# Patient Record
Sex: Female | Born: 1955 | Race: Black or African American | Hispanic: No | Marital: Single | Smoking: Former smoker
Health system: Southern US, Community
[De-identification: ages and names within clinical notes are randomized; demographics above are authoritative.]

## PROBLEM LIST (undated history)

## (undated) DIAGNOSIS — R569 Unspecified convulsions: Secondary | ICD-10-CM

## (undated) DIAGNOSIS — G9341 Metabolic encephalopathy: Secondary | ICD-10-CM

## (undated) DIAGNOSIS — E119 Type 2 diabetes mellitus without complications: Secondary | ICD-10-CM

## (undated) DIAGNOSIS — N186 End stage renal disease: Secondary | ICD-10-CM

## (undated) DIAGNOSIS — I10 Essential (primary) hypertension: Secondary | ICD-10-CM

## (undated) DIAGNOSIS — N289 Disorder of kidney and ureter, unspecified: Secondary | ICD-10-CM

## (undated) DIAGNOSIS — R011 Cardiac murmur, unspecified: Secondary | ICD-10-CM

## (undated) HISTORY — PX: EYE SURGERY: SHX253

## (undated) HISTORY — PX: CATARACT EXTRACTION: SUR2

## (undated) HISTORY — DX: Type 2 diabetes mellitus without complications: E11.9

## (undated) HISTORY — DX: Essential (primary) hypertension: I10

## (undated) HISTORY — DX: Unspecified convulsions: R56.9

## (undated) HISTORY — DX: Cardiac murmur, unspecified: R01.1

## (undated) HISTORY — DX: Disorder of kidney and ureter, unspecified: N28.9

---

## 2017-10-06 NOTE — Progress Notes (Signed)
[facility] Note  10/07/2017     CHIEF COMPLAINT Patient presents for Retina Evaluation and Diabetic Eye Exam   HISTORY OF PRESENT ILLNESS: Joann Bailey is a 62 y.o. female who presents to the clinic today for:   HPI    Retina Evaluation    In both eyes.  This started 1 month ago.  Associated Symptoms Floaters.  Negative for Blind Spot, Glare, Shoulder/Hip pain, Fatigue, Jaw Claudication, Photophobia, Distortion, Redness, Scalp Tenderness, Weight Loss, Fever, Trauma, Pain and Flashes.  Context:  distance vision, mid-range vision and near vision.  Treatments tried include no treatments, laser and surgery.  Response to treatment was mild improvement.  I, the attending physician,  performed the HPI with the patient and updated documentation appropriately.          Diabetic Eye Exam    Vision is blurred for distance, fluctuates with blood sugars and is blurred for near.  Associated Symptoms Negative for Blind Spot, Glare, Shoulder/Hip pain, Fatigue, Jaw Claudication, Photophobia, Distortion, Weight Loss, Scalp Tenderness, Redness, Floaters, Flashes, Pain, Trauma and Fever.  Diabetes characteristics include Type 2, on insulin and taking oral medications.  This started 18.  Blood sugar level fluctuates.  Last A1C 7.  Associated Diagnosis Dialysis and Kidney Disease.  I, the attending physician,  performed the HPI with the patient and updated documentation appropriately.          Comments    Patient referred by Dr. Owens Shark for retinal eval to evaluate possible TRD OS. Patient states she started having floaters(hair like) in her right eye appx one month ago, two weeks ago she noticed floaters in her left eye. Pt is a diabetic, she is on insulin and glipizide,and Januvia, she does not check Bs regularly, last A1C 7.0 three months ago. Pt reports her Bs are unstable. Pt reports she had laser tx several years ago ou, cataract sx Ou appx 5 yrs ago.  She is a dialysis  patient.  Pt take fish oil amd multivitamins. Denies gtt's       Last edited by Bernarda Caffey, MD on 10/07/2017 10:37 AM. (History)    Pt states she has seen Dr. Owens Shark twice; Pt states she has been treated with retinal lasers at "[facility]"; Pt states she has also had cataract sx; Pt states she has been having trouble with OS x "a couple of weeks ago"; Pt states she had been seeing floaters after "falling out of the bed"; Pt states she is on dialysis 2x a week; Pt is unsure when she began having problems with OU;  Referring physician: Montel Culver, MD [address] [place] [address]  HISTORICAL INFORMATION:   Selected notes from the MEDICAL RECORD NUMBER Referred by Dr. Kristian Covey for concern of TRD OS; LEE- [BCVA OD: 20/200 OS: 20/70] Ocular Hx- laser sx for diabetic retinopathy OU, pseudophakia OU PMH- HTN, DM, kidney failure    CURRENT MEDICATIONS: No current outpatient medications on file. (Ophthalmic Drugs)   No current facility-administered medications for this visit.  (Ophthalmic Drugs)   Current Outpatient Medications (Other)  Medication Sig  . ALPRAZolam (XANAX) 1 MG tablet Take by mouth.  Marland Kitchen amLODipine (NORVASC) 10 MG tablet Take by mouth.  Marland Kitchen amLODipine (NORVASC) 10 MG tablet   . aspirin 81 MG tablet 81 mg  . atenolol (TENORMIN) 50 MG tablet Take by mouth.  . clopidogrel (PLAVIX) 75 MG tablet Take by mouth.  . furosemide (LASIX) 20 MG tablet   .  gabapentin (NEURONTIN) 800 MG tablet Take by mouth.  Marland Kitchen glipiZIDE (GLUCOTROL) 10 MG tablet Take by mouth.  . hydrALAZINE (APRESOLINE) 100 MG tablet Take by mouth.  . insulin aspart (NOVOLOG) cartridge Inject into the skin.  Marland Kitchen insulin detemir (LEVEMIR) 100 UNIT/ML injection Inject into the skin.  Marland Kitchen levETIRAcetam (KEPPRA) 500 MG tablet Take 500 mg by mouth 2 (two) times daily.  . Omega-3 Fat Ac-Cholecalciferol (MINICAPS VITAMIN-D/OMEGA-3 PO) Take by mouth.  Marland Kitchen omeprazole (PRILOSEC) 20 MG capsule   . sitaGLIPtin (JANUVIA) 100 MG tablet  Take by mouth.   No current facility-administered medications for this visit.  (Other)      REVIEW OF SYSTEMS: ROS    Positive for: Neurological, Genitourinary, Musculoskeletal, Endocrine, Cardiovascular, Eyes, Respiratory   Negative for: Constitutional, Gastrointestinal, Skin, HENT, Allergic/Imm, Heme/Lymph   Last edited by Zenovia Jordan, LPN on 0/03/3817  2:99 AM. (History)       ALLERGIES No Known Allergies  PAST MEDICAL HISTORY Past Medical History:  Diagnosis Date  . Diabetes ([place])   . Heart murmur   . Hypertension   . Kidney disease    Past Surgical History:  Procedure Laterality Date  . CATARACT EXTRACTION    . EYE SURGERY      FAMILY HISTORY Family History  Problem Relation Age of Onset  . Hypertension Mother   . Cancer Mother   . Hypertension Father   . Diabetes Father   . Hypertension Sister   . Diabetes Sister   . Hypertension Brother   . Diabetes Brother     SOCIAL HISTORY Social History   Tobacco Use  . Smoking status: Former Research scientist (life sciences)  . Smokeless tobacco: Never Used  Substance Use Topics  . Alcohol use: Never    Frequency: Never  . Drug use: Never         OPHTHALMIC EXAM:  Base Eye Exam    Visual Acuity (Snellen - Linear)      Right Left   Dist [place] 20/150 CF at 2'   Dist ph [place] 20/100 -2 20/400       Tonometry (Tonopen, 9:17 AM)      Right Left   Pressure 17 14       Pupils      Dark Light Shape React APD   Right 3 3 Round NR None   Left 3 3 Round NR None       Visual Fields (Counting fingers)      Left Right   Restrictions Partial outer superior temporal, inferior temporal, superior nasal, inferior nasal deficiencies Partial outer superior temporal, inferior temporal deficiencies       Extraocular Movement      Right Left    Full, Ortho Full, Ortho       Neuro/Psych    Oriented x3:  Yes   Mood/Affect:  Normal       Dilation    Both eyes:  1.0% Mydriacyl, 2.5% Phenylephrine @ 9:17 AM        Slit Lamp and  Fundus Exam    Slit Lamp Exam      Right Left   Lids/Lashes Dermatochalasis - upper lid Dermatochalasis - upper lid   Conjunctiva/Sclera Melanosis Melanosis   Cornea Mild Arcus Mild Arcus   Anterior Chamber Deep and quiet Deep and quiet   Iris Round and dilated, No NVI Round and dilated, No NVI   Lens PC IOL in good position PC IOL in good position   Vitreous clear +heme, fibrosis  Fundus Exam      Right Left   Disc mild Pallor hazy view, perfused   C/D Ratio 0.5    Macula Blunted foveal reflex, central atrophy, Pigment clumping, temporal fibrosis, IRH inferiorly , Microaneurysms No details visible, obsured by heme   Vessels Severe Vascular attenuation, sclerotic arterioles +NVE and preretinal fibrosis   Periphery Dense PRP, pre-retinal fibrosis nasal to disc,  Superior pre-retinal fibrosis, IRH, PRP scars, large area of pre-retinal fibrosis with focal tractional retinal detachment, red vitreous hemorrhage inferiorly obscuring view        Refraction    Manifest Refraction (Auto)      Sphere Cylinder Axis Dist [place]   Right -1.00 +0.75 179 20/100-2   Left unable     Would not read OS. Attempted refraction/retinoscope OS-dark reflex OS.          IMAGING AND PROCEDURES  Imaging and Procedures for 10/12/17  OCT, Retina - OU - Both Eyes       Right Eye Quality was good. Central Foveal Thickness: 141. Progression has no prior data. Findings include abnormal foveal contour, outer retinal atrophy, inner retinal atrophy, intraretinal fluid, vitreous traction.   Left Eye Quality was poor. Progression has no prior data.   Notes *Images captured and stored on drive  Diagnosis / Impression:  OD: severe retinal atrophy; no CSME OS: no view  Clinical management:  See below  Abbreviations: NFP - Normal foveal profile. CME - cystoid macular edema. PED - pigment epithelial detachment. IRF - intraretinal fluid. SRF - subretinal fluid. EZ - ellipsoid zone. ERM - epiretinal  membrane. ORA - outer retinal atrophy. ORT - outer retinal tubulation. SRHM - subretinal hyper-reflective material         B-Scan Ultrasound - OS - Left Eye       Quality was good. Findings included vitreous hemorrhage, vitreous opacities, subhyaloid opacities, traction retinal detachment.   Notes Impression:  OS: vitreous opacities consistent with vitreous hemorrhage; hyperechoic signal along superior arcades consistent with subhyaloid opacities and concerning for tractional retinal detachment. No mass or rhegmatogenous RD noted.                ASSESSMENT/PLAN:    ICD-10-CM   1. 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 Vitreous hemorrhage of left eye (HCC) H43.12 B-Scan Ultrasound - OS - Left Eye  3. Retinal edema H35.81 OCT, Retina - OU - Both Eyes  4. Pseudophakia of both eyes Z96.1     1. Proliferative diabetic retinopathy, OU - The incidence, risk factors for progression, natural history and treatment options for diabetic retinopathy  were discussed with patient.   - The need for close monitoring of blood glucose, blood pressure, and serum lipids, avoiding cigarette or any type of tobacco, and the need for long term follow up was also discussed with patient. - exam shows extensive central retinal atrophy and low vision OD;  OS with dense central vitreous hemorrhage, preretinal fibrosis along superior arcades with focal tractional RD - likely needs PPV w/ membrane peel and additional PRP laser OS - discussed possibility of surgery, but pt gets dialysis on Tuesdays and Thursdays - pt reports history of ophthalmic care at [facility] -- poor historian, but has had cataract surgery and/or retinal surgery/laser at [facility] - pt wishes to coordinate care at [facility] - will refer pt to [facility] for further evaluation and management  2. Vitreous  Hemorrhage OS secondary to  DMII VH precautions reviewed -- minimize activities, keep head elevated, avoid ASA/NSAIDs/blood thinners as able - refer to [facility]  3. Retinal edema   4. Pseudophakia OU  - s/p CE/IOL  - beautiful surgery, doing well  - monitor   Ophthalmic Meds Ordered this visit:  No orders of the defined types were placed in this encounter.      Return if symptoms worsen or fail to improve.  There are no Patient Instructions on file for this visit.   Explained the diagnoses, plan, and follow up with the patient and they expressed understanding.  Patient expressed understanding of the importance of proper follow up care.   This document serves as a record of services personally performed by Gardiner Sleeper, MD, PhD. It was created on their behalf by Catha Brow, [place], a certified ophthalmic assistant. The creation of this record is the provider's dictation and/or activities during the visit.  Electronically signed by: Catha Brow, [place]  10/12/17 2:02 AM   Gardiner Sleeper, M.D., Ph.D. Diseases & Surgery of the Retina and [place] 10/12/17  I have reviewed the above documentation for accuracy and completeness, and I agree with the above. Gardiner Sleeper, M.D., Ph.D. 10/12/17 2:02 AM     Abbreviations: M myopia (nearsighted); A astigmatism; H hyperopia (farsighted); P presbyopia; Mrx spectacle prescription;  CTL contact lenses; OD right eye; OS left eye; OU both eyes  XT exotropia; ET esotropia; PEK punctate epithelial keratitis; PEE punctate epithelial erosions; DES dry eye syndrome; MGD meibomian gland dysfunction; ATs artificial tears; PFAT's preservative free artificial tears; [place] nuclear sclerotic cataract; PSC posterior subcapsular cataract; ERM epi-retinal membrane; PVD posterior vitreous detachment; RD retinal detachment; DM diabetes mellitus; DR diabetic retinopathy; NPDR non-proliferative diabetic retinopathy; PDR proliferative diabetic  retinopathy; CSME clinically significant macular edema; DME diabetic macular edema; dbh dot blot hemorrhages; CWS cotton wool spot; POAG primary open angle glaucoma; C/D cup-to-disc ratio; HVF humphrey visual field; GVF goldmann visual field; OCT optical coherence tomography; IOP intraocular pressure; BRVO Branch retinal vein occlusion; CRVO central retinal vein occlusion; CRAO central retinal artery occlusion; BRAO branch retinal artery occlusion; RT retinal tear; SB scleral buckle; PPV pars plana vitrectomy; VH Vitreous hemorrhage; PRP panretinal laser photocoagulation; IVK intravitreal kenalog; VMT vitreomacular traction; MH Macular hole;  NVD neovascularization of the disc; NVE neovascularization elsewhere; AREDS age related eye disease study; ARMD age related macular degeneration; POAG primary open angle glaucoma; EBMD epithelial/anterior basement membrane dystrophy; ACIOL anterior chamber intraocular lens; IOL intraocular lens; PCIOL posterior chamber intraocular lens; Phaco/IOL phacoemulsification with intraocular lens placement; [place] photorefractive keratectomy; LASIK laser assisted in situ keratomileusis; HTN hypertension; DM diabetes mellitus; COPD chronic obstructive pulmonary disease

## 2017-10-07 ENCOUNTER — Encounter (INDEPENDENT_AMBULATORY_CARE_PROVIDER_SITE_OTHER): Payer: Self-pay | Admitting: Ophthalmology

## 2017-10-07 ENCOUNTER — Ambulatory Visit (INDEPENDENT_AMBULATORY_CARE_PROVIDER_SITE_OTHER): Payer: Medicare Other | Admitting: Ophthalmology

## 2017-10-07 DIAGNOSIS — H3581 Retinal edema: Secondary | ICD-10-CM

## 2017-10-07 DIAGNOSIS — H4312 Vitreous hemorrhage, left eye: Secondary | ICD-10-CM

## 2017-10-07 DIAGNOSIS — Z961 Presence of intraocular lens: Secondary | ICD-10-CM

## 2017-10-07 DIAGNOSIS — E113593 Type 2 diabetes mellitus with proliferative diabetic retinopathy without macular edema, bilateral: Secondary | ICD-10-CM

## 2020-01-16 ENCOUNTER — Encounter (HOSPITAL_COMMUNITY): Payer: Self-pay

## 2020-01-16 ENCOUNTER — Emergency Department (HOSPITAL_COMMUNITY): Payer: Medicare Other

## 2020-01-16 ENCOUNTER — Other Ambulatory Visit: Payer: Self-pay

## 2020-01-16 ENCOUNTER — Inpatient Hospital Stay (HOSPITAL_COMMUNITY)
Admission: EM | Admit: 2020-01-16 | Discharge: 2020-01-19 | DRG: 698 | Disposition: A | Discharge status: Home Health | Payer: Medicare Other | Attending: Internal Medicine | Admitting: Internal Medicine

## 2020-01-16 DIAGNOSIS — Z681 Body mass index (BMI) 19 or less, adult: Secondary | ICD-10-CM

## 2020-01-16 DIAGNOSIS — Z862 Personal history of diseases of the blood and blood-forming organs and certain disorders involving the immune mechanism: Secondary | ICD-10-CM | POA: Diagnosis not present

## 2020-01-16 DIAGNOSIS — Z794 Long term (current) use of insulin: Secondary | ICD-10-CM | POA: Diagnosis not present

## 2020-01-16 DIAGNOSIS — Z09 Encounter for follow-up examination after completed treatment for conditions other than malignant neoplasm: Secondary | ICD-10-CM

## 2020-01-16 DIAGNOSIS — J811 Chronic pulmonary edema: Secondary | ICD-10-CM | POA: Diagnosis present

## 2020-01-16 DIAGNOSIS — G40909 Epilepsy, unspecified, not intractable, without status epilepticus: Secondary | ICD-10-CM | POA: Diagnosis present

## 2020-01-16 DIAGNOSIS — Z87891 Personal history of nicotine dependence: Secondary | ICD-10-CM | POA: Diagnosis not present

## 2020-01-16 DIAGNOSIS — R64 Cachexia: Secondary | ICD-10-CM | POA: Diagnosis present

## 2020-01-16 DIAGNOSIS — Z79899 Other long term (current) drug therapy: Secondary | ICD-10-CM | POA: Diagnosis not present

## 2020-01-16 DIAGNOSIS — I10 Essential (primary) hypertension: Secondary | ICD-10-CM | POA: Diagnosis not present

## 2020-01-16 DIAGNOSIS — Z7982 Long term (current) use of aspirin: Secondary | ICD-10-CM | POA: Diagnosis not present

## 2020-01-16 DIAGNOSIS — R809 Proteinuria, unspecified: Secondary | ICD-10-CM

## 2020-01-16 DIAGNOSIS — L8990 Pressure ulcer of unspecified site, unspecified stage: Secondary | ICD-10-CM | POA: Diagnosis present

## 2020-01-16 DIAGNOSIS — J810 Acute pulmonary edema: Secondary | ICD-10-CM | POA: Diagnosis present

## 2020-01-16 DIAGNOSIS — L89309 Pressure ulcer of unspecified buttock, unspecified stage: Secondary | ICD-10-CM | POA: Diagnosis not present

## 2020-01-16 DIAGNOSIS — Z20822 Contact with and (suspected) exposure to covid-19: Secondary | ICD-10-CM | POA: Diagnosis present

## 2020-01-16 DIAGNOSIS — R159 Full incontinence of feces: Secondary | ICD-10-CM | POA: Diagnosis present

## 2020-01-16 DIAGNOSIS — Z992 Dependence on renal dialysis: Secondary | ICD-10-CM

## 2020-01-16 DIAGNOSIS — N186 End stage renal disease: Secondary | ICD-10-CM

## 2020-01-16 DIAGNOSIS — G9341 Metabolic encephalopathy: Secondary | ICD-10-CM | POA: Diagnosis present

## 2020-01-16 DIAGNOSIS — R197 Diarrhea, unspecified: Secondary | ICD-10-CM | POA: Diagnosis present

## 2020-01-16 DIAGNOSIS — Z9115 Patient's noncompliance with renal dialysis: Secondary | ICD-10-CM | POA: Diagnosis not present

## 2020-01-16 DIAGNOSIS — L89891 Pressure ulcer of other site, stage 1: Secondary | ICD-10-CM

## 2020-01-16 DIAGNOSIS — D696 Thrombocytopenia, unspecified: Secondary | ICD-10-CM | POA: Diagnosis present

## 2020-01-16 DIAGNOSIS — E1122 Type 2 diabetes mellitus with diabetic chronic kidney disease: Principal | ICD-10-CM | POA: Diagnosis present

## 2020-01-16 DIAGNOSIS — E119 Type 2 diabetes mellitus without complications: Secondary | ICD-10-CM

## 2020-01-16 DIAGNOSIS — E1129 Type 2 diabetes mellitus with other diabetic kidney complication: Secondary | ICD-10-CM

## 2020-01-16 DIAGNOSIS — K529 Noninfective gastroenteritis and colitis, unspecified: Secondary | ICD-10-CM

## 2020-01-16 DIAGNOSIS — D631 Anemia in chronic kidney disease: Secondary | ICD-10-CM | POA: Diagnosis present

## 2020-01-16 DIAGNOSIS — N189 Chronic kidney disease, unspecified: Secondary | ICD-10-CM | POA: Diagnosis not present

## 2020-01-16 HISTORY — DX: End stage renal disease: N18.6

## 2020-01-16 HISTORY — DX: Metabolic encephalopathy: G93.41

## 2020-01-16 LAB — CBG MONITORING, ED
Glucose-Capillary: 206 mg/dL — ABNORMAL HIGH (ref 70–99)
Glucose-Capillary: 252 mg/dL — ABNORMAL HIGH (ref 70–99)

## 2020-01-16 LAB — CBC WITH DIFFERENTIAL/PLATELET
Abs Immature Granulocytes: 0.03 10*3/uL (ref 0.00–0.07)
Basophils Absolute: 0 10*3/uL (ref 0.0–0.1)
Basophils Relative: 1 %
Eosinophils Absolute: 0.1 10*3/uL (ref 0.0–0.5)
Eosinophils Relative: 1 %
HCT: 34.2 % — ABNORMAL LOW (ref 36.0–46.0)
Hemoglobin: 9.9 g/dL — ABNORMAL LOW (ref 12.0–15.0)
Immature Granulocytes: 1 %
Lymphocytes Relative: 16 %
Lymphs Abs: 0.7 10*3/uL (ref 0.7–4.0)
MCH: 27.3 pg (ref 26.0–34.0)
MCHC: 28.9 g/dL — ABNORMAL LOW (ref 30.0–36.0)
MCV: 94.2 fL (ref 80.0–100.0)
Monocytes Absolute: 0.7 10*3/uL (ref 0.1–1.0)
Monocytes Relative: 14 %
Neutro Abs: 3.1 10*3/uL (ref 1.7–7.7)
Neutrophils Relative %: 67 %
Platelets: 127 10*3/uL — ABNORMAL LOW (ref 150–400)
RBC: 3.63 MIL/uL — ABNORMAL LOW (ref 3.87–5.11)
RDW: 15.6 % — ABNORMAL HIGH (ref 11.5–15.5)
WBC: 4.6 10*3/uL (ref 4.0–10.5)
nRBC: 0 % (ref 0.0–0.2)

## 2020-01-16 LAB — BASIC METABOLIC PANEL
Anion gap: 19 — ABNORMAL HIGH (ref 5–15)
BUN: 91 mg/dL — ABNORMAL HIGH (ref 8–23)
CO2: 22 mmol/L (ref 22–32)
Calcium: 9.3 mg/dL (ref 8.9–10.3)
Chloride: 102 mmol/L (ref 98–111)
Creatinine, Ser: 11.4 mg/dL — ABNORMAL HIGH (ref 0.44–1.00)
GFR calc Af Amer: 4 mL/min — ABNORMAL LOW (ref >60)
GFR calc non Af Amer: 3 mL/min — ABNORMAL LOW (ref >60)
Glucose, Bld: 232 mg/dL — ABNORMAL HIGH (ref 70–99)
Potassium: 4.5 mmol/L (ref 3.5–5.1)
Sodium: 143 mmol/L (ref 135–145)

## 2020-01-16 LAB — SARS CORONAVIRUS 2 BY RT PCR (HOSPITAL ORDER, PERFORMED IN ~~LOC~~ HOSPITAL LAB): SARS Coronavirus 2: NEGATIVE

## 2020-01-16 LAB — C DIFFICILE QUICK SCREEN W PCR REFLEX
C Diff antigen: NEGATIVE
C Diff interpretation: NOT DETECTED
C Diff toxin: NEGATIVE

## 2020-01-16 LAB — VALPROIC ACID LEVEL: Valproic Acid Lvl: 11 ug/mL — ABNORMAL LOW (ref 50.0–100.0)

## 2020-01-16 MED ORDER — GERHARDT'S BUTT CREAM
TOPICAL_CREAM | Freq: Two times a day (BID) | CUTANEOUS | Status: DC
  Filled 2020-01-16 (×2): qty 1

## 2020-01-16 MED ORDER — FERRIC CITRATE 1 GM 210 MG(FE) PO TABS
420.0000 mg | ORAL_TABLET | Freq: Three times a day (TID) | ORAL | Status: DC
  Filled 2020-01-16 (×8): qty 2

## 2020-01-16 MED ORDER — DIVALPROEX SODIUM ER 500 MG PO TB24
750.0000 mg | ORAL_TABLET | Freq: Every day | ORAL | Status: DC
  Administered 2020-01-16 – 2020-01-18 (×2): 750 mg via ORAL
  Filled 2020-01-16 (×4): qty 1

## 2020-01-16 MED ORDER — HYDRALAZINE HCL 50 MG PO TABS
100.0000 mg | ORAL_TABLET | Freq: Three times a day (TID) | ORAL | Status: DC
  Administered 2020-01-17 – 2020-01-19 (×5): 100 mg via ORAL
  Filled 2020-01-16 (×10): qty 2

## 2020-01-16 MED ORDER — HEPARIN SODIUM (PORCINE) 5000 UNIT/ML IJ SOLN
5000.0000 [IU] | Freq: Three times a day (TID) | INTRAMUSCULAR | Status: DC
  Filled 2020-01-16 (×4): qty 1

## 2020-01-16 MED ORDER — INSULIN ASPART 100 UNIT/ML ~~LOC~~ SOLN
0.0000 [IU] | Freq: Three times a day (TID) | SUBCUTANEOUS | Status: DC
  Administered 2020-01-16: 3 [IU] via SUBCUTANEOUS
  Administered 2020-01-18: 2 [IU] via SUBCUTANEOUS
  Administered 2020-01-18: 1 [IU] via SUBCUTANEOUS
  Filled 2020-01-16: qty 0.06

## 2020-01-16 MED ORDER — SODIUM CHLORIDE 0.9 % IV SOLN: 250.0000 mL | INTRAVENOUS | Status: DC | PRN

## 2020-01-16 MED ORDER — CHLORHEXIDINE GLUCONATE CLOTH 2 % EX PADS
6.0000 | MEDICATED_PAD | Freq: Every day | CUTANEOUS | Status: DC
  Administered 2020-01-19: 6 via TOPICAL

## 2020-01-16 MED ORDER — SODIUM CHLORIDE 0.9% FLUSH: 3.0000 mL | Freq: Two times a day (BID) | INTRAVENOUS | Status: DC

## 2020-01-16 MED ORDER — MELATONIN 3 MG PO TABS
3.0000 mg | ORAL_TABLET | Freq: Every day | ORAL | Status: DC
  Administered 2020-01-17 – 2020-01-18 (×2): 3 mg via ORAL
  Filled 2020-01-16 (×2): qty 1

## 2020-01-16 MED ORDER — ONDANSETRON HCL 4 MG PO TABS: 4.0000 mg | ORAL_TABLET | Freq: Four times a day (QID) | ORAL | Status: DC | PRN

## 2020-01-16 MED ORDER — NYSTATIN 100000 UNIT/GM EX CREA
1.0000 "application " | TOPICAL_CREAM | Freq: Two times a day (BID) | CUTANEOUS | Status: DC
  Administered 2020-01-18 (×2): 1 via TOPICAL
  Filled 2020-01-16 (×2): qty 15

## 2020-01-16 MED ORDER — ACETAMINOPHEN 650 MG RE SUPP: 650.0000 mg | Freq: Four times a day (QID) | RECTAL | Status: DC | PRN

## 2020-01-16 MED ORDER — AMLODIPINE BESYLATE 10 MG PO TABS
10.0000 mg | ORAL_TABLET | Freq: Every day | ORAL | Status: DC
  Administered 2020-01-18 – 2020-01-19 (×2): 10 mg via ORAL
  Filled 2020-01-16 (×2): qty 1

## 2020-01-16 MED ORDER — ACETAMINOPHEN 325 MG PO TABS
650.0000 mg | ORAL_TABLET | Freq: Four times a day (QID) | ORAL | Status: DC | PRN
  Administered 2020-01-16 – 2020-01-19 (×3): 650 mg via ORAL
  Filled 2020-01-16 (×3): qty 2

## 2020-01-16 MED ORDER — VITAMIN D 25 MCG (1000 UNIT) PO TABS
5000.0000 [IU] | ORAL_TABLET | Freq: Every day | ORAL | Status: DC
  Administered 2020-01-16 – 2020-01-19 (×3): 5000 [IU] via ORAL
  Filled 2020-01-16 (×3): qty 5

## 2020-01-16 MED ORDER — ATORVASTATIN CALCIUM 40 MG PO TABS
40.0000 mg | ORAL_TABLET | Freq: Every day | ORAL | Status: DC
  Administered 2020-01-16 – 2020-01-19 (×3): 40 mg via ORAL
  Filled 2020-01-16 (×3): qty 1

## 2020-01-16 MED ORDER — ONDANSETRON HCL 4 MG/2ML IJ SOLN: 4.0000 mg | Freq: Four times a day (QID) | INTRAMUSCULAR | Status: DC | PRN

## 2020-01-16 MED ORDER — ASPIRIN EC 81 MG PO TBEC
81.0000 mg | DELAYED_RELEASE_TABLET | Freq: Every day | ORAL | Status: DC
  Administered 2020-01-16 – 2020-01-19 (×3): 81 mg via ORAL
  Filled 2020-01-16 (×3): qty 1

## 2020-01-16 MED ORDER — METOPROLOL TARTRATE 25 MG PO TABS: 50.0000 mg | ORAL_TABLET | Freq: Two times a day (BID) | ORAL | Status: DC

## 2020-01-16 MED ORDER — SODIUM CHLORIDE 0.9% FLUSH: 3.0000 mL | INTRAVENOUS | Status: DC | PRN

## 2020-01-16 NOTE — ED Notes (Addendum)
Daughter of pt attempted to persuade the pt to stay to receive treatment. Pt continues to refuse treatment. Daughter of pt is sending son to [facility] to speak to the pt in person to attempt to persuade the pt to stay. Hospitalist aware of situation and states if the pt continues to refuse then she will need to sign out AMA.

## 2020-01-16 NOTE — ED Notes (Signed)
This Probation officer attempted to explain to the family member that we needed a legal document stating Joann Bailey to keep her in the hospital for the families wishes, contacted our Education officer, museum, Roderic Palau, and he spoke with patient and she decided that she would go to [facility] for dialysis and then she wants to go home after that.

## 2020-01-16 NOTE — ED Notes (Signed)
[facility] called for transport to [facility]

## 2020-01-16 NOTE — Progress Notes (Addendum)
CSW met with pt who initially declined dialysis at [facility].  CSW employed motivational interviewing technique and validated pt's frustrations with lack of physically visible presence of family and after some discussion was agreeable to being transported to [facility] via [facility] to participate in dialysis.  Pt did not provided verbal permission for the CSW to update pt's daughter and expressed frustration with the family members for not being present.  Pt states that at this time while agreeable to being placed in a room at [facility] and participating in dialysis she still would like to leave after that, and return home, and that she would like the CSW to document her wishes.  CSW requested pt repeat this in presence of the [facility] ED CN, which the pt did.  Pt then pt requested snacks and a soda which the CN stated would be provided.  Pt thanked the CSW and was verbally appreciative of the CN's and CSW's presence as they left the room.  CN states she will update the hospitalist.  CSW will continue to follow for D/C needs.  Joann Bailey. [name]  MSW, LCSW, LCAS, CCS Transitions of Care Clinical Social Worker Care Coordination Department Ph: [phone]

## 2020-01-16 NOTE — ED Triage Notes (Signed)
Patient was brought to ED by brother however patient does not know why she is here. Patient is incontinent of stool and states her "butt hurts".   Spoke with niece on phone she stated pt hasn't had Dialysis since last Thursday. She is scheduled Tues, Thurs, Sat. Pt is having continuous soft bowel movements. Niece stated the concern was she is getting pressure ulcer on buttocks reinfected with stool. Patient taken to [facility] ED            [place] , [place]) . Pt was given prescription for Doxycycline 100mg  Q 12hrs. Medication has not been started. Patient is to follow up with home health.

## 2020-01-16 NOTE — ED Notes (Addendum)
Attempted to call report to [facility], but RN was busy. Sec states RN will call back.

## 2020-01-16 NOTE — Consult Note (Signed)
Renal Service Consult Note [facility]  Joann Bailey 01/16/2020 Joann Bailey Requesting Physician:  Dr Joann Bailey, T.   Reason for Consult:  ESRD pt w/ missed dialysis HPI: The patient is a 64 y.o. year-old w/ hx of DM2 on insulin, HTN, depression/ anxeity, seizure d/o and ESRD on HD TTS who presented to ED yest for missed dialysis x 2.  Reportedly she could not make dialysis x last 2 sessions due to rectal pain/ ulcer and also had transportation issues.  Asked to see for dialysis.    Pt seen in ED.  Takes HD in [place], maybe 2 yrs, pt not sure.  She is not sure MWF or TTS schedule.  Denies any CP, abd pain. Denies N/V.  Has pain on "my back side".     ROS  denies CP  no joint pain   no HA  no blurry vision  no rash  no diarrhea  no nausea/ vomiting     Past Medical History  Past Medical History:  Diagnosis Date  . Diabetes ([place])   . ESRD (end stage renal disease) on dialysis ([place])   . Heart murmur   . Hypertension   . Metabolic encephalopathy    Past Surgical History  Past Surgical History:  Procedure Laterality Date  . CATARACT EXTRACTION    . EYE SURGERY     Family History  Family History  Problem Relation Age of Onset  . Hypertension Mother   . Cancer Mother   . Hypertension Father   . Diabetes Father   . Hypertension Sister   . Diabetes Sister   . Hypertension Brother   . Diabetes Brother    Social History  reports that she has quit smoking. She has never used smokeless tobacco. She reports that she does not drink alcohol and does not use drugs. Allergies  Allergies  Allergen Reactions  . Duloxetine Itching    Other reaction(s): Other (See Comments)   Home medications Prior to Admission medications   Medication Sig Start Date End Date Taking? Authorizing Provider  amLODipine (NORVASC) 10 MG tablet Take 10 mg by mouth daily.    Yes [provider]  aspirin 81 MG tablet Take 81 mg by mouth daily.  10/21/10  Yes [provider]  atorvastatin (LIPITOR) 40 MG tablet Take 40 mg by mouth daily.   Yes [provider]  cholecalciferol (VITAMIN D3) 25 MCG (1000 UNIT) tablet Take 5,000 Units by mouth daily.   Yes [provider]  divalproex (DEPAKOTE ER) 250 MG 24 hr tablet Take 750 mg by mouth at bedtime.   Yes [provider]  ferric citrate (AURYXIA) 1 GM 210 MG(Fe) tablet Take 420 mg by mouth 3 (three) times daily with meals.   Yes [provider]  hydrALAZINE (APRESOLINE) 100 MG tablet Take 100 mg by mouth 3 (three) times daily.    Yes [provider]  insulin glargine (LANTUS SOLOSTAR) 100 UNIT/ML Solostar Pen Inject 10 Units into the skin at bedtime.   Yes [provider]  insulin lispro (HUMALOG) 100 UNIT/ML injection Inject 0-5 Units into the skin 3 (three) times daily before meals. Sliding scale   Yes [provider]  melatonin 3 MG TABS tablet Take 3 mg by mouth at bedtime.   Yes [provider]  metoprolol tartrate (LOPRESSOR) 50 MG tablet Take 50 mg by mouth 2 (two) times daily.   Yes [provider]  nystatin cream (MYCOSTATIN) Apply 1 application topically 2 (  two) times daily.   Yes [provider]     Vitals:   01/16/20 0051 01/16/20 0335 01/16/20 0535 01/16/20 0700  BP: (!) 148/79  96/71 (!) 133/44  Pulse: (!) 53  (!) 53 (!) 53  Resp: 16  18 15   Temp: (!) 97.4 F (36.3 C)  98.2 F (36.8 C)   TempSrc: Oral  Oral   SpO2: 93%  98% 97%  Weight:  56.7 kg    Height:  5\' 4"  (1.626 m)     Exam Gen small framed older adult female, no distress, calm No rash, cyanosis or gangrene Sclera anicteric, throat clear  + JVD Chest clear bilat to bases no rales, wheezing or bronchial BS RRR no RG, +2/6 sem Abd soft ntnd no mass or ascites +bs GU defer MS no joint effusions or deformity Ext no LE or UE edema, no wounds or ulcers Neuro is alert, nonfocal and conversant    Home meds:  - norvasc 10/ asa 81/  lipitor 40/ hydralazine 100 tid/ metoprolol 50 bid  - insulin lantus 10 hs/ humalog 0-5 tid ssi  - depakote er 750 hs  - ferric citrate 420 ac tid  - prn's/ vitamins/ supplements    CXR - IMPRESSION: Cardiomegaly with interstitial thickening suspicious for pulmonary edema. Left lower thorax and costophrenic angle are not included in the field of view.   BP 139/ 44, HR 55, RR 16,  RA 89- 94%    Na 143  K4.5  BJ 91 CR 11.4 Ca 9.3  WBC 4k  Hb 9.9     OP HD: [place] TTS  pending   Assessment/ Plan: 1. Pulm edema - w/ SOB, missed HD x 2.  Plan HD today over at [facility], max UF as will tolerate  2. ESRD - on HD in [place], get records.  Not sure schedule.  3. IDDM 4. Seizure d/o 5. Anemia ckd - Hb 9.9, follow 6. MBD ckd - Ca okay, cont binder      Joann Splinter  MD 01/16/2020, 9:07 AM  Recent Labs  Lab 01/16/20 0534  WBC 4.6  HGB 9.9*   Recent Labs  Lab 01/16/20 0534  K 4.5  BUN 91*  CREATININE 11.40*  CALCIUM 9.3

## 2020-01-16 NOTE — H&P (Signed)
History and Physical    Joann Bailey XTG:[id number] DOB: Nov 23, 1955 DOA: 01/16/2020  PCP: Ocie Bob, FNP   Patient coming from: Home  I have personally briefly reviewed patient's old medical records in [place]  Chief Complaint: Missed dialysis  HPI: Joann Bailey is a 64 y.o. female with medical history significant for end-stage renal disease on hemodialysis (T/TH/S), diabetes mellitus, hypertension, depression anxiety, seizure disorder and narcolepsy who was brought into the emergency room by family members last night.  Patient was recently discharged from hospital in [place] on 01/07/20.  Patient's last dialysis treatment was on Thursday 06/24.  Patient has not had any further renal replacement therapy since then because she is unable to sit in the dialysis chair for a long period of time due to pain in the perianal area from an ulcer. She also does not have transportation to get the dialysis center as scheduled which is why she was brought to the hospital because her niece is concerned that she has not had dialysis in a week.  Family is also concerned that her decubitus ulcer is getting reinfected with stool. Patient denies having any chest pain, shortness of breath, nausea, vomiting, dizziness, lightheadedness, fever or chills. Labs reveal a hemoglobin of 9.9, potassium of 4.5, serum creatinine of 11.4 and a BUN of 91 and glucose of 232. Chest x-ray shows cardiomegaly with interstitial thickening suspicious for pulmonary edema. Twelve-lead EKG shows sinus bradycardia, low voltage QRS.  ED Course: Patient is a 64 year old female with multiple medical problems who was brought into the emergency room by family members because of missed dialysis.  Patient lives in [place] but is nonmobile and family is unable to get her to her scheduled dialysis treatments because of transportation issues.  Chest x-ray shows pulmonary edema but electrolytes are within normal  limits.  Patient will be admitted to the hospital and nephrology will be consulted.  Review of Systems: As per HPI otherwise 10 point review of systems negative.    Past Medical History:  Diagnosis Date  . Diabetes ([place])   . ESRD (end stage renal disease) on dialysis ([place])   . Heart murmur   . Hypertension   . Metabolic encephalopathy     Past Surgical History:  Procedure Laterality Date  . CATARACT EXTRACTION    . EYE SURGERY       reports that she has quit smoking. She has never used smokeless tobacco. She reports that she does not drink alcohol and does not use drugs.  Allergies  Allergen Reactions  . Duloxetine Itching    Other reaction(s): Other (See Comments)    Family History  Problem Relation Age of Onset  . Hypertension Mother   . Cancer Mother   . Hypertension Father   . Diabetes Father   . Hypertension Sister   . Diabetes Sister   . Hypertension Brother   . Diabetes Brother      Prior to Admission medications   Medication Sig Start Date End Date Taking? Authorizing Provider  amLODipine (NORVASC) 10 MG tablet Take 10 mg by mouth daily.    Yes [provider]  aspirin 81 MG tablet Take 81 mg by mouth daily.  10/21/10  Yes [provider]  atorvastatin (LIPITOR) 40 MG tablet Take 40 mg by mouth daily.   Yes [provider]  cholecalciferol (VITAMIN D3) 25 MCG (1000 UNIT) tablet Take 5,000 Units by mouth daily.   Yes [provider]  divalproex (DEPAKOTE ER) 250  MG 24 hr tablet Take 750 mg by mouth at bedtime.   Yes [provider]  ferric citrate (AURYXIA) 1 GM 210 MG(Fe) tablet Take 420 mg by mouth 3 (three) times daily with meals.   Yes [provider]  hydrALAZINE (APRESOLINE) 100 MG tablet Take 100 mg by mouth 3 (three) times daily.    Yes [provider]  insulin glargine (LANTUS SOLOSTAR) 100 UNIT/ML Solostar Pen Inject 10 Units into the skin at bedtime.   Yes [provider]    insulin lispro (HUMALOG) 100 UNIT/ML injection Inject 0-5 Units into the skin 3 (three) times daily before meals. Sliding scale   Yes [provider]  melatonin 3 MG TABS tablet Take 3 mg by mouth at bedtime.   Yes [provider]  metoprolol tartrate (LOPRESSOR) 50 MG tablet Take 50 mg by mouth 2 (two) times daily.   Yes [provider]  nystatin cream (MYCOSTATIN) Apply 1 application topically 2 (two) times daily.   Yes [provider]    Physical Exam: Vitals:   01/16/20 0333 01/16/20 0335 01/16/20 0535 01/16/20 0700  BP: (!) 148/79  96/71 (!) 133/44  Pulse: (!) 53  (!) 53 (!) 53  Resp: 16  18 15   Temp: (!) 97.4 F (36.3 C)  98.2 F (36.8 C)   TempSrc: Oral  Oral   SpO2: 93%  98% 97%  Weight:  56.7 kg    Height:  5\' 4"  (1.626 m)       Vitals:   01/16/20 0333 01/16/20 0335 01/16/20 0535 01/16/20 0700  BP: (!) 148/79  96/71 (!) 133/44  Pulse: (!) 53  (!) 53 (!) 53  Resp: 16  18 15   Temp: (!) 97.4 F (36.3 C)  98.2 F (36.8 C)   TempSrc: Oral  Oral   SpO2: 93%  98% 97%  Weight:  56.7 kg    Height:  5\' 4"  (1.626 m)      Constitutional: NAD, alert and oriented x 2 (person and place) Eyes: PERRL, lids and conjunctivae pallor ENMT: Mucous membranes are moist.  Neck: normal, supple, no masses, no thyromegaly Respiratory: Rales at the bases, no wheezing, no crackles. Normal respiratory effort. No accessory muscle use.  Cardiovascular: Regular rate and rhythm, no murmurs / rubs / gallops. No extremity edema. 2+ pedal pulses. No carotid bruits.  Abdomen: no tenderness, no masses palpated. No hepatosplenomegaly. Bowel sounds positive.  Musculoskeletal: no clubbing / cyanosis. No joint deformity upper and lower extremities.  Skin: no rashes, lesions, decubitus ulcer in the gluteal area (POA) Neurologic: No gross focal neurologic deficit.  Generalized weakness Psychiatric: Normal mood and affect.   Labs on Admission: I have personally  reviewed following labs and imaging studies  CBC: Recent Labs  Lab 01/16/20 0534  WBC 4.6  NEUTROABS 3.1  HGB 9.9*  HCT 34.2*  MCV 94.2  PLT 413*   Basic Metabolic Panel: Recent Labs  Lab 01/16/20 0534  NA 143  K 4.5  CL 102  CO2 22  GLUCOSE 232*  BUN 91*  CREATININE 11.40*  CALCIUM 9.3   GFR: Estimated Creatinine Clearance: 4.5 mL/min (A) (by C-G formula based on SCr of 11.4 mg/dL (H)). Liver Function Tests: No results for input(s): AST, ALT, ALKPHOS, BILITOT, PROT, ALBUMIN in the last 168 hours. No results for input(s): LIPASE, AMYLASE in the last 168 hours. No results for input(s): AMMONIA in the last 168 hours. Coagulation Profile: No results for input(s): INR, PROTIME in the last  168 hours. Cardiac Enzymes: No results for input(s): CKTOTAL, CKMB, CKMBINDEX, TROPONINI in the last 168 hours. BNP (last 3 results) No results for input(s): PROBNP in the last 8760 hours. HbA1C: No results for input(s): HGBA1C in the last 72 hours. CBG: No results for input(s): GLUCAP in the last 168 hours. Lipid Profile: No results for input(s): CHOL, HDL, LDLCALC, TRIG, CHOLHDL, LDLDIRECT in the last 72 hours. Thyroid Function Tests: No results for input(s): TSH, T4TOTAL, FREET4, T3FREE, THYROIDAB in the last 72 hours. Anemia Panel: No results for input(s): VITAMINB12, FOLATE, FERRITIN, TIBC, IRON, RETICCTPCT in the last 72 hours. Urine analysis: No results found for: COLORURINE, APPEARANCEUR, LABSPEC, [place], GLUCOSEU, HGBUR, BILIRUBINUR, KETONESUR, PROTEINUR, UROBILINOGEN, NITRITE, LEUKOCYTESUR  Radiological Exams on Admission: DG Chest 1 View  Result Date: 01/16/2020 CLINICAL DATA:  Shortness of breath. EXAM: CHEST  1 VIEW COMPARISON:  08/13/2019 FINDINGS: Low lung volumes. Unchanged cardiomegaly. Interstitial thickening suspicious for pulmonary edema. Left lower chest and costophrenic angle are not included in the field of view. No pneumothorax or large pleural effusion.  Bones are under mineralized. IMPRESSION: Cardiomegaly with interstitial thickening suspicious for pulmonary edema. Left lower thorax and costophrenic angle are not included in the field of view. Electronically Signed   By: Keith Rake M.D.   On: 01/16/2020 04:19    EKG: Independently reviewed.  Sinus bradycardia Low voltage QRS  Assessment/Plan Principal Problem:   ESRD (end stage renal disease) on dialysis [facility]) Active Problems:   Diabetes ([place])   Hypertension   Seizure disorder ([place])   Decubitus ulcer    End-stage renal disease on hemodialysis Patient's dialysis days are T/TH/S Patient has not had dialysis since Thursday, 01/10/20 Chest x-ray shows findings concerning for pulmonary edema but potassium and bicarbonate levels are within normal limits Patient is not hypoxic we will request nephrology consult for renal replacement therapy We will request case management/social work assistance  Diabetes mellitus with complications of end-stage renal disease Maintain consistent carbohydrate diet Sliding scale coverage with insulin   Seizure disorder Continue Depakote Place patient on seizure precautions   Hypertension Blood pressure stable Continue hydralazine and amlodipine Hold metoprolol for bradycardia Obtain TSH levels   Decubitus ulcer We will request wound care evaluation  DVT prophylaxis: Heparin Code Status: Full code Family Communication: Greater than 50% of time was spent discussing patient's condition and plan of care with her daughter Rainbow Salman and niece Sharyn Lull over the phone.  All questions and concerns have been addressed.  They verbalized understanding and agreed with the plan. Disposition Plan: Back to previous home environment Consults called: Nephrology    Collier Bullock MD [facility]     01/16/2020, 7:54 AM

## 2020-01-16 NOTE — ED Notes (Signed)
[facility] arrived to transport the pt to [facility], but pt is stating she would like to go home. Hospitalist paged at this time.

## 2020-01-16 NOTE — ED Provider Notes (Signed)
[facility] DEPT Provider Note: Georgena Spurling, MD, FACEP  CSN: [id number] MRN: [id number] ARRIVAL: 01/16/20 at [facility]: [place]  Diarrhea (pressure ulcer on buttocks)  Level 5 caveat: Dementia HISTORY OF PRESENT ILLNESS  01/16/20 4:30 AM Joann Bailey is a 64 y.o. female with dementia (neurosyphilis and CVA history) and end-stage renal disease who has not had dialysis since Thursday (6 days ago) due to inability to sit upright in her dialysis chair for more than 30 minutes.  She she normally receives dialysis Tuesday, Thursday and Saturday.  She has been having continuous soft bowel movements and reportedly has a sacral decubitus ulcer which is painful.  She was recently seen at [facility] in [place] 01/03/2020 and was discharged 01/07/2020 with a prescription for doxycycline 100 mg every 12 hours.  She has not gotten this prescription filled.  The discharge plan plan was to have her follow-up with home health as well as with [facility] and neurology.  She has a history of C. difficile in the past but her discharge summary from [facility] states C. difficile testing was negative.  The patient states she lives both here and in [place] having relatives in both locations.  The above history was obtained by nursing staff from a family member who left soon after bringing the patient to the ED.  Past Medical History:  Diagnosis Date  . Diabetes ([place])   . ESRD (end stage renal disease) on dialysis ([place])   . Heart murmur   . Hypertension   . Metabolic encephalopathy     Past Surgical History:  Procedure Laterality Date  . CATARACT EXTRACTION    . EYE SURGERY      Family History  Problem Relation Age of Onset  . Hypertension Mother   . Cancer Mother   . Hypertension Father   . Diabetes Father   . Hypertension Sister   . Diabetes Sister   . Hypertension Brother   . Diabetes Brother     Social History   Tobacco Use    . Smoking status: Former Research scientist (life sciences)  . Smokeless tobacco: Never Used  Vaping Use  . Vaping Use: Never used  Substance Use Topics  . Alcohol use: Never  . Drug use: Never    Prior to Admission medications   Medication Sig Start Date End Date Taking? Authorizing Provider  amLODipine (NORVASC) 10 MG tablet Take 10 mg by mouth daily.    Yes [provider]  aspirin 81 MG tablet Take 81 mg by mouth daily.  10/21/10  Yes [provider]  atorvastatin (LIPITOR) 40 MG tablet Take 40 mg by mouth daily.   Yes [provider]  cholecalciferol (VITAMIN D3) 25 MCG (1000 UNIT) tablet Take 5,000 Units by mouth daily.   Yes [provider]  divalproex (DEPAKOTE ER) 250 MG 24 hr tablet Take 750 mg by mouth at bedtime.   Yes [provider]  ferric citrate (AURYXIA) 1 GM 210 MG(Fe) tablet Take 420 mg by mouth 3 (three) times daily with meals.   Yes [provider]  hydrALAZINE (APRESOLINE) 100 MG tablet Take 100 mg by mouth 3 (three) times daily.    Yes [provider]  insulin glargine (LANTUS SOLOSTAR) 100 UNIT/ML Solostar Pen Inject 10 Units into the skin at bedtime.   Yes [provider]  insulin lispro (HUMALOG) 100 UNIT/ML injection Inject 0-5 Units into the skin 3 (three) times daily before meals. Sliding scale  Yes [provider]  melatonin 3 MG TABS tablet Take 3 mg by mouth at bedtime.   Yes [provider]  metoprolol tartrate (LOPRESSOR) 50 MG tablet Take 50 mg by mouth 2 (two) times daily.   Yes [provider]  nystatin cream (MYCOSTATIN) Apply 1 application topically 2 (two) times daily.   Yes [provider]    Allergies Duloxetine   REVIEW OF SYSTEMS     PHYSICAL EXAMINATION  Initial Vital Signs Blood pressure (!) 148/79, pulse (!) 53, temperature (!) 97.4 F (36.3 C), temperature source Oral, resp. rate 16, height 5\' 4"  (1.626 m), weight 56.7 kg, SpO2 93  %.  Examination General: Well-developed, cachectic female in no acute distress; appears older than age of record HENT: normocephalic; atraumatic Eyes: pupils equal, round and reactive to light; extraocular muscles intact me: Bilateral pseudophakia Neck: supple Heart: regular rate and rhythm; bradycardia Lungs: clear to auscultation bilaterally Abdomen: soft; nondistended; nontender; no masses or hepatosplenomegaly; bowel sounds present Rectal: Chronic perirectal scarring with surrounding stage I/II decubitus ulcer:      Extremities: No deformity; full range of motion; pulses normal; dialysis fistula right upper arm with pulse and thrill Neurologic: Awake, alert and oriented x2; noted to move all extremities; no facial droop Skin: Warm and dry Psychiatric: Flat affect with a emotionless, staccato speech   RESULTS  Summary of this visit's results, reviewed and interpreted by myself:   EKG Interpretation  Date/Time:  Wednesday January 16 2020 04:05:53 EDT Ventricular Rate:  52 PR Interval:    QRS Duration: 104 QT Interval:  459 QTC Calculation: 427 R Axis:   113 Text Interpretation: Sinus bradycardia Right axis deviation Low voltage, extremity leads Abnormal R-wave progression, late transition Nonspecific T abnormalities, lateral leads Confirmed by Johnson Arizola, Jenny Reichmann [phone]) on 01/16/2020 4:12:08 AM      Laboratory Studies: Results for orders placed or performed during the hospital encounter of 01/16/20 (from the past 24 hour(s))  C Difficile Quick Screen w PCR reflex     Status: None   Collection Time: 01/16/20  3:57 AM   Specimen: STOOL  Result Value Ref Range   C Diff antigen NEGATIVE NEGATIVE   C Diff toxin NEGATIVE NEGATIVE   C Diff interpretation No C. difficile detected.   CBC with Differential     Status: Abnormal   Collection Time: 01/16/20  5:34 AM  Result Value Ref Range   WBC 4.6 4.0 - 10.5 K/uL   RBC 3.63 (L) 3.87 - 5.11 MIL/uL   Hemoglobin 9.9 (L) 12.0 - 15.0 g/dL    HCT 34.2 (L) 36 - 46 %   MCV 94.2 80.0 - 100.0 fL   MCH 27.3 26.0 - 34.0 pg   MCHC 28.9 (L) 30.0 - 36.0 g/dL   RDW 15.6 (H) 11.5 - 15.5 %   Platelets 127 (L) 150 - 400 K/uL   nRBC 0.0 0.0 - 0.2 %   Neutrophils Relative % 67 %   Neutro Abs 3.1 1.7 - 7.7 K/uL   Lymphocytes Relative 16 %   Lymphs Abs 0.7 0.7 - 4.0 K/uL   Monocytes Relative 14 %   Monocytes Absolute 0.7 0 - 1 K/uL   Eosinophils Relative 1 %   Eosinophils Absolute 0.1 0 - 0 K/uL   Basophils Relative 1 %   Basophils Absolute 0.0 0 - 0 K/uL   Immature Granulocytes 1 %   Abs Immature Granulocytes 0.03 0.00 - 0.07 K/uL  Basic metabolic panel  Status: Abnormal   Collection Time: 01/16/20  5:34 AM  Result Value Ref Range   Sodium 143 135 - 145 mmol/L   Potassium 4.5 3.5 - 5.1 mmol/L   Chloride 102 98 - 111 mmol/L   CO2 22 22 - 32 mmol/L   Glucose, Bld 232 (H) 70 - 99 mg/dL   BUN 91 (H) 8 - 23 mg/dL   Creatinine, Ser 11.40 (H) 0.44 - 1.00 mg/dL   Calcium 9.3 8.9 - 10.3 mg/dL   GFR calc non Af Amer 3 (L) >60 mL/min   GFR calc Af Amer 4 (L) >60 mL/min   Anion gap 19 (H) 5 - 15  Valproic acid level     Status: Abnormal   Collection Time: 01/16/20  5:34 AM  Result Value Ref Range   Valproic Acid Lvl 11 (L) 50.0 - 100.0 ug/mL   Imaging Studies: DG Chest 1 View  Result Date: 01/16/2020 CLINICAL DATA:  Shortness of breath. EXAM: CHEST  1 VIEW COMPARISON:  08/13/2019 FINDINGS: Low lung volumes. Unchanged cardiomegaly. Interstitial thickening suspicious for pulmonary edema. Left lower chest and costophrenic angle are not included in the field of view. No pneumothorax or large pleural effusion. Bones are under mineralized. IMPRESSION: Cardiomegaly with interstitial thickening suspicious for pulmonary edema. Left lower thorax and costophrenic angle are not included in the field of view. Electronically Signed   By: Keith Rake M.D.   On: 01/16/2020 04:19    ED COURSE and MDM  Nursing notes, initial and subsequent  vitals signs, including pulse oximetry, reviewed and interpreted by myself.  Vitals:   01/16/20 0333 01/16/20 0335 01/16/20 0535 01/16/20 0700  BP: (!) 148/79  96/71 (!) 133/44  Pulse: (!) 53  (!) 53 (!) 53  Resp: 16  18 15   Temp: (!) 97.4 F (36.3 C)  98.2 F (36.8 C)   TempSrc: Oral  Oral   SpO2: 93%  98% 97%  Weight:  56.7 kg    Height:  5\' 4"  (1.626 m)     Medications - No data to display  6:25 AM The patient is in need of dialysis given BUN of 91 and creatinine of 11.4.  She has some edema on her chest x-ray but is not hypoxic and is not hyperkalemic so does not require emergent dialysis.  As noted above family is not present and I have not been able to speak with him.  The patient has some cognitive deficits which makes her dependent for ADLs.  We will have her admitted for dialysis with wound care and likely social work consult to assist with home health/dialysis.  PROCEDURES  Procedures   ED DIAGNOSES     ICD-10-CM   1. Dialysis patient, noncompliant ([place])  Z91.15   2. Acute pulmonary edema (HCC)  J81.0   3. Decubitus ulcer of perianal region, stage I  L89.891   4. Chronic diarrhea  K52.9        Labarron Durnin, Jenny Reichmann, MD 01/16/20 [phone]

## 2020-01-16 NOTE — Consult Note (Signed)
WOC Nurse Consult Note: Reason for Consult:Full thickness moisture associated skin damage (MASD) to perirectal skin.  History C Diff with recurring diarrhea.  Has not  Been able to go to dialysis due to perirectal pain.   Wound type: MASD Pressure Injury POA: NA- area is not over of bony prominence and is frequently moist due to incontinence of stool.  Measurement: 2 cm circumferential skin breakdown Wound XTK:WIOXB red and bleeds easily.  Drainage (amount, consistency, odor) bleeds.  Odor of stool.  Periwound: moist Dressing procedure/placement/frequency: Cleanse rectal skin with soap and water and pat dry.  Apply Gerhardts butt paste twice daily and PRN soilage.  Keep skin clean and dry.   Will not follow at this time.  Please re-consult if needed.  Domenic Moras MSN, RN, FNP-BC CWON Wound, Ostomy, Continence Nurse Pager [phone]

## 2020-01-16 NOTE — Progress Notes (Signed)
CSW was informed by pt's RN that pt refused [place] transport to [facility] to be admitted and to receive needed dialysis tx.  Pt is from the [place], [place] area.  CSW will continue to follow for D/C needs.  Alphonse Guild. Lura Falor  MSW, LCSW, LCAS, CCS Transitions of Care Clinical Social Worker Care Coordination Department Ph: [phone]

## 2020-01-16 NOTE — Progress Notes (Addendum)
CSW received a call from pt's ED CN who updated the CSW.  CSW was asked to review chart to see if there has been any behavioral health intervention(s) as pt's daughter has stated that behavioral health [facility]) is assisting with keeping pt at the hospital and/or making decisions regarding pt's tx as pt is not making good decisions.  CSW reviewed chart and sees no evidence of this AEB there being no [facility] documentation on this or a prior visit.  CSW sees no behavioral health intervention in pt' chart going back to 2009.  CSW will continue to follow for D/C needs.  Joann Bailey. Joann Bailey  MSW, LCSW, LCAS, CCS Transitions of Care Clinical Social Worker Care Coordination Department Ph: [phone]

## 2020-01-17 DIAGNOSIS — N189 Chronic kidney disease, unspecified: Secondary | ICD-10-CM

## 2020-01-17 DIAGNOSIS — Z862 Personal history of diseases of the blood and blood-forming organs and certain disorders involving the immune mechanism: Secondary | ICD-10-CM

## 2020-01-17 DIAGNOSIS — G40909 Epilepsy, unspecified, not intractable, without status epilepticus: Secondary | ICD-10-CM

## 2020-01-17 DIAGNOSIS — I10 Essential (primary) hypertension: Secondary | ICD-10-CM

## 2020-01-17 LAB — CBC WITH DIFFERENTIAL/PLATELET
Abs Immature Granulocytes: 0.01 10*3/uL (ref 0.00–0.07)
Basophils Absolute: 0 10*3/uL (ref 0.0–0.1)
Basophils Relative: 1 %
Eosinophils Absolute: 0.1 10*3/uL (ref 0.0–0.5)
Eosinophils Relative: 2 %
HCT: 32.2 % — ABNORMAL LOW (ref 36.0–46.0)
Hemoglobin: 9.7 g/dL — ABNORMAL LOW (ref 12.0–15.0)
Immature Granulocytes: 0 %
Lymphocytes Relative: 26 %
Lymphs Abs: 1.1 10*3/uL (ref 0.7–4.0)
MCH: 28 pg (ref 26.0–34.0)
MCHC: 30.1 g/dL (ref 30.0–36.0)
MCV: 93.1 fL (ref 80.0–100.0)
Monocytes Absolute: 0.6 10*3/uL (ref 0.1–1.0)
Monocytes Relative: 14 %
Neutro Abs: 2.6 10*3/uL (ref 1.7–7.7)
Neutrophils Relative %: 57 %
Platelets: 115 10*3/uL — ABNORMAL LOW (ref 150–400)
RBC: 3.46 MIL/uL — ABNORMAL LOW (ref 3.87–5.11)
RDW: 15.3 % (ref 11.5–15.5)
WBC: 4.5 10*3/uL (ref 4.0–10.5)
nRBC: 0 % (ref 0.0–0.2)

## 2020-01-17 LAB — HEMOGLOBIN A1C
Hgb A1c MFr Bld: 8 % — ABNORMAL HIGH (ref 4.8–5.6)
Mean Plasma Glucose: 182.9 mg/dL

## 2020-01-17 LAB — COMPREHENSIVE METABOLIC PANEL
ALT: 13 U/L (ref 0–44)
AST: 12 U/L — ABNORMAL LOW (ref 15–41)
Albumin: 2.9 g/dL — ABNORMAL LOW (ref 3.5–5.0)
Alkaline Phosphatase: 100 U/L (ref 38–126)
Anion gap: 18 — ABNORMAL HIGH (ref 5–15)
BUN: 90 mg/dL — ABNORMAL HIGH (ref 8–23)
CO2: 21 mmol/L — ABNORMAL LOW (ref 22–32)
Calcium: 8.9 mg/dL (ref 8.9–10.3)
Chloride: 103 mmol/L (ref 98–111)
Creatinine, Ser: 12.38 mg/dL — ABNORMAL HIGH (ref 0.44–1.00)
GFR calc Af Amer: 3 mL/min — ABNORMAL LOW (ref >60)
GFR calc non Af Amer: 3 mL/min — ABNORMAL LOW (ref >60)
Glucose, Bld: 124 mg/dL — ABNORMAL HIGH (ref 70–99)
Potassium: 4.4 mmol/L (ref 3.5–5.1)
Sodium: 142 mmol/L (ref 135–145)
Total Bilirubin: 1.1 mg/dL (ref 0.3–1.2)
Total Protein: 6.6 g/dL (ref 6.5–8.1)

## 2020-01-17 LAB — PHOSPHORUS: Phosphorus: 7 mg/dL — ABNORMAL HIGH (ref 2.5–4.6)

## 2020-01-17 LAB — GLUCOSE, CAPILLARY: Glucose-Capillary: 83 mg/dL (ref 70–99)

## 2020-01-17 LAB — MAGNESIUM: Magnesium: 2 mg/dL (ref 1.7–2.4)

## 2020-01-17 LAB — CBG MONITORING, ED: Glucose-Capillary: 137 mg/dL — ABNORMAL HIGH (ref 70–99)

## 2020-01-17 MED ORDER — DIPHENHYDRAMINE HCL 25 MG PO CAPS: 25.0000 mg | ORAL_CAPSULE | Freq: Once | ORAL | Status: DC

## 2020-01-17 MED ORDER — HYDRALAZINE HCL 20 MG/ML IJ SOLN: 10.0000 mg | Freq: Four times a day (QID) | INTRAMUSCULAR | Status: DC | PRN

## 2020-01-17 MED ORDER — DIPHENHYDRAMINE HCL 25 MG PO CAPS
ORAL_CAPSULE | ORAL | Status: AC
  Administered 2020-01-17: 25 mg
  Filled 2020-01-17: qty 1

## 2020-01-17 MED ORDER — METOPROLOL TARTRATE 50 MG PO TABS
50.0000 mg | ORAL_TABLET | Freq: Two times a day (BID) | ORAL | Status: DC
  Administered 2020-01-18 – 2020-01-19 (×2): 50 mg via ORAL
  Filled 2020-01-17 (×4): qty 1

## 2020-01-17 NOTE — Progress Notes (Signed)
PROGRESS NOTE    Joann [facility]  YKZ:[id number] DOB: [birth year] DOA: 01/16/2020 PCP: Joann Bob, FNP   Brief Narrative:  HPI per Dr. Collier [facility] on 01/16/20 [facility] is a 64 y.o. female with medical history significant for end-stage renal disease on hemodialysis (T/TH/S), diabetes mellitus, hypertension, depression anxiety, seizure disorder and narcolepsy who was brought into the emergency room by family members last night.  Patient was recently discharged from [facility] in [place] on 01/07/20.  Patient's last dialysis treatment was on Thursday 06/24.  Patient has not had any further renal replacement therapy since then because she is unable to sit in the dialysis chair for a long period of time due to pain in the perianal area from an ulcer. She also does not have transportation to get the dialysis center as scheduled which is why she was brought to the [facility] because her niece is concerned that she has not had dialysis in a week.  Family is also concerned that her decubitus ulcer is getting reinfected with stool. Patient denies having any chest pain, shortness of breath, nausea, vomiting, dizziness, lightheadedness, fever or chills. Labs reveal a hemoglobin of 9.9, potassium of 4.5, serum creatinine of 11.4 and a BUN of 91 and glucose of 232. Chest x-ray shows cardiomegaly with interstitial thickening suspicious for pulmonary edema. Twelve-lead EKG shows sinus bradycardia, low voltage QRS.  ED Course: Patient is a 64 year old female with multiple medical problems who was brought into the emergency room by family members because of missed dialysis.  Patient lives in [place] but is nonmobile and family is unable to get her to her scheduled dialysis treatments because of transportation issues.  Chest x-ray shows pulmonary edema but electrolytes are within normal limits.  Patient will be admitted to the [facility] and nephrology will be consulted.  **Interim  History She waited in the emergency room as there is no beds available until today where she will be going to [facility] and she will be going straight to dialysis.  She remains somewhat confused and withdrawn. Nephrology following closely.  Assessment & Plan:   Principal Problem:   ESRD (end stage renal disease) on dialysis [facility]) Active Problems:   Diabetes ([facility])   Hypertension   Seizure disorder ([facility])   Decubitus ulcer  End-Stage Renal Disease on Hemodialysis Elevated Anion Gap -Patient's dialysis days are T/TH/S -Patient has not had dialysis since Thursday, 01/10/20 -Chest x-ray shows findings concerning for pulmonary edema but potassium and bicarbonate levels are within normal limits -Patient is not hypoxic -we will request nephrology consult for renal replacement therapy and she will be dialyzed today -BUN/Cr was 91/11.40 and repeat not Done today despite labs being ordered -We will request case management/social work assistance  Diabetes Mellitus with complications of End-Stage Renal Disease -Maintain consistent Renal Carbohydrate diet -Sliding scale coverage with insulin (Very Sensitive Scale) -Hemoglobin A1c was 8.0 -CBG's ranging from 137-252; Blood Sugar on AM BMP was 232  Seizure Disorder -Continue Depakote ER 750 mg po BID -[facility] Level was 11 -Place patient on seizure precautions  Hypertension -Blood pressure stable and slightly elevated with last pressure being 156/61  -Continue Hydralazine and but hold amlodipine per Nephrology Recc's -Hold metoprolol for bradycardia (HR ranging from 46-64) -Obtain TSH levels but labs not repeated as she has refused   Bradycardia -Asymptomatic -Hold Metoprolol -Continue to Monitor on Telemetry   Anemia of Chronic Kidney Disease -Patient's Hgb/HCt yesterday was 9.9/34.2 -Check Anemia Panel in the AM -Getting Derric Citrate 420  mg po TIDwm -Continue to Monitor for S/Sx of Bleeding; Currently no overt bleeding  noted  -Repeat CBC this AM  Thrombocytopenia -Patient's Platelet Count was 127 -Repeat not done this AM -Continue to Monitor for S/Sx of Bleeding  -Repeat CBC in the AM  Decubitus ulcer/MASD, poA -We will request wound care evaluation -C/w WOC Nurse Recc's and [facility]'s butt paste BID  DVT prophylaxis: Heparin 5,000 units sq q8h Code Status: FULL CODE Family Communication: No family present at bedside  Disposition Plan: Transfer to [facility] to the Care of Dr. Birdie [facility] for further workup and Dialysis   Status is: Inpatient  Remains inpatient appropriate because:Altered mental status, Unsafe d/c plan, IV treatments appropriate due to intensity of illness or inability to take PO and Inpatient level of care appropriate due to severity of illness   Dispo: The patient is from: Home              Anticipated d/c is to: TBD              Anticipated d/c date is: 1 day              Patient currently is not medically stable to d/c.  Consultants:   Nephrology    Procedures: Hemodialysis   Antimicrobials:  Anti-infectives (From admission, onward)   None     Subjective: Seen and examined at bedside in the emergency room and she was very withdrawn and would mumble her answers and was not speaking properly.  She only stated that she wanted a "blanket."  She wanted to be left alone and appeared a little confused.  No nausea or vomiting.  No other concerns or complaints at this time.  Objective: Vitals:   01/17/20 0300 01/17/20 0400 01/17/20 0810 01/17/20 0900  BP: (!) 157/72 (!) 158/75 (!) 165/69 (!) 156/61  Pulse: 61 60 (!) 55 (!) 46  Resp: 16  10 14   Temp:   98 F (36.7 C)   TempSrc:   Oral   SpO2: 98% 98% 96% 96%  Weight:      Height:       No intake or output data in the 24 hours ending 01/17/20 1135 Filed Weights   01/16/20 0335  Weight: 56.7 kg   Examination: Physical Exam:  Constitutional: WN/WD African-American female currently in NAD and appears  uncomfortable Eyes: Liids and conjunctivae normal, sclerae anicteric  ENMT: External Ears, Nose appear normal. Grossly normal hearing. Neck: Appears normal, supple, no cervical masses, normal ROM, no appreciable thyromegaly, no JVD Respiratory: Diminished to auscultation bilaterally, no wheezing, rales, rhonchi or crackles. Normal respiratory effort and patient is not tachypenic. No accessory muscle use.  Unlabored breathing Cardiovascular: RRR, no murmurs / rubs / gallops. S1 and S2 auscultated. No extremity edema. 2+ pedal pulses. No carotid bruits.  Abdomen: Soft, non-tender, non-distended.  Bowel sounds positive.  GU: Deferred. Musculoskeletal: No clubbing / cyanosis of digits/nails. No joint deformity upper and lower extremities.  Has a right arm upper fistula with a palpable thrill and a good bruit on auscultation Skin: No rashes, lesions, ulcers but she does have a right arm wound and she does have a decubitus. No induration; Warm and dry.  Neurologic: CN 2-12 grossly intact with no focal deficits but she is extremely withdrawn. Romberg sign and cerebellar reflexes not assessed.  Psychiatric: Slightly impaired judgment and insight. Alert and oriented x 3. Withdrawn mood and appropriate affect.   Data Reviewed: I have personally reviewed following labs and imaging  studies  CBC: Recent Labs  Lab 01/16/20 0534  WBC 4.6  NEUTROABS 3.1  HGB 9.9*  HCT 34.2*  MCV 94.2  PLT 314*   Basic Metabolic Panel: Recent Labs  Lab 01/16/20 0534  NA 143  K 4.5  CL 102  CO2 22  GLUCOSE 232*  BUN 91*  CREATININE 11.40*  CALCIUM 9.3   GFR: Estimated Creatinine Clearance: 4.5 mL/min (A) (by C-G formula based on SCr of 11.4 mg/dL (H)). Liver Function Tests: No results for input(s): AST, ALT, ALKPHOS, BILITOT, PROT, ALBUMIN in the last 168 hours. No results for input(s): LIPASE, AMYLASE in the last 168 hours. No results for input(s): AMMONIA in the last 168 hours. Coagulation Profile: No  results for input(s): INR, PROTIME in the last 168 hours. Cardiac Enzymes: No results for input(s): CKTOTAL, CKMB, CKMBINDEX, TROPONINI in the last 168 hours. BNP (last 3 results) No results for input(s): PROBNP in the last 8760 hours. HbA1C: Recent Labs    01/16/20 0534  HGBA1C 8.0*   CBG: Recent Labs  Lab 01/16/20 1133 01/16/20 2146 01/17/20 0759  GLUCAP 206* 252* 137*   Lipid Profile: No results for input(s): CHOL, HDL, LDLCALC, TRIG, CHOLHDL, LDLDIRECT in the last 72 hours. Thyroid Function Tests: No results for input(s): TSH, T4TOTAL, FREET4, T3FREE, THYROIDAB in the last 72 hours. Anemia Panel: No results for input(s): VITAMINB12, FOLATE, FERRITIN, TIBC, IRON, RETICCTPCT in the last 72 hours. Sepsis Labs: No results for input(s): PROCALCITON, LATICACIDVEN in the last 168 hours.  Recent Results (from the past 240 hour(s))  C Difficile Quick Screen w PCR reflex     Status: None   Collection Time: 01/16/20  3:57 AM   Specimen: STOOL  Result Value Ref Range Status   C Diff antigen NEGATIVE NEGATIVE Final   C Diff toxin NEGATIVE NEGATIVE Final   C Diff interpretation No C. difficile detected.  Final    Comment: Performed at [facility], [place] [address], [place], [place] [address]  SARS Coronavirus 2 by RT PCR ([facility] order, performed in [facility] lab) Nasopharyngeal Nasopharyngeal Swab     Status: None   Collection Time: 01/16/20  7:14 AM   Specimen: Nasopharyngeal Swab  Result Value Ref Range Status   SARS Coronavirus 2 NEGATIVE NEGATIVE Final    Comment: (NOTE) SARS-CoV-2 target nucleic acids are NOT DETECTED.  The SARS-CoV-2 RNA is generally detectable in upper and lower respiratory specimens during the acute phase of infection. The lowest concentration of SARS-CoV-2 viral copies this assay can detect is 250 copies / mL. A negative result does not preclude SARS-CoV-2 infection and should not be used as the sole basis for treatment  or other patient management decisions.  A negative result may occur with improper specimen collection / handling, submission of specimen other than nasopharyngeal swab, presence of viral mutation(s) within the areas targeted by this assay, and inadequate number of viral copies (<250 copies / mL). A negative result must be combined with clinical observations, patient history, and epidemiological information.  Fact Sheet for Patients:   StrictlyIdeas.no  Fact Sheet for Healthcare Providers: BankingDealers.co.za  This test is not yet approved or  cleared by the [place] FDA and has been authorized for detection and/or diagnosis of SARS-CoV-2 by FDA under an Emergency Use Authorization (EUA).  This EUA will remain in effect (meaning this test can be used) for the duration of the COVID-19 declaration under Section 564(b)(1) of the Act, 21 U.S.C. section 360bbb-3(b)(1), unless the authorization  is terminated or revoked sooner.  Performed at [facility], [place] [address], [place], [place] [address]     RN Pressure Injury Documentation:     Estimated body mass index is 21.46 kg/m as calculated from the following:   Height as of this encounter: 5\' 4"  (1.626 m).   Weight as of this encounter: 56.7 kg.  Malnutrition Type:      Malnutrition Characteristics:      Nutrition Interventions:    Radiology Studies: DG Chest 1 View  Result Date: 01/16/2020 CLINICAL DATA:  Shortness of breath. EXAM: CHEST  1 VIEW COMPARISON:  08/13/2019 FINDINGS: Low lung volumes. Unchanged cardiomegaly. Interstitial thickening suspicious for pulmonary edema. Left lower chest and costophrenic angle are not included in the field of view. No pneumothorax or large pleural effusion. Bones are under mineralized. IMPRESSION: Cardiomegaly with interstitial thickening suspicious for pulmonary edema. Left lower thorax and costophrenic angle are  not included in the field of view. Electronically Signed   By: Keith Rake M.D.   On: 01/16/2020 04:19   Scheduled Meds:  amLODipine  10 mg Oral Daily   aspirin EC  81 mg Oral Daily   atorvastatin  40 mg Oral Daily   Chlorhexidine Gluconate Cloth  6 each Topical Q0600   cholecalciferol  5,000 Units Oral Daily   divalproex  750 mg Oral QHS   ferric citrate  420 mg Oral TID WC   [facility]'s butt cream   Topical BID   heparin  5,000 Units Subcutaneous Q8H   hydrALAZINE  100 mg Oral TID   insulin aspart  0-6 Units Subcutaneous TID WC   melatonin  3 mg Oral QHS   nystatin cream  1 application Topical BID   Continuous Infusions:  sodium chloride       LOS: 1 day   Kerney Elbe, DO [facility] PAGER is on AMION  If 7PM-7AM, please contact night-coverage [email or web address]

## 2020-01-17 NOTE — ED Notes (Signed)
Patient refused IV, lab work and all medication. At this time patient is agreeing to go to [facility] for dialysis and admission.

## 2020-01-17 NOTE — ED Notes (Signed)
Spoke with [facility] at [facility], patient can come over without an inpatient bed assignment. [facility] called for transport. Papers at nurse's station.

## 2020-01-17 NOTE — ED Notes (Signed)
Patient refusing IV and bloodwork at this time.

## 2020-01-17 NOTE — Progress Notes (Signed)
[facility] Progress Note  Subjective: pt is over at [facility] on HD now, no new c/o's  Vitals:   01/17/20 1330 01/17/20 1400 01/17/20 1430 01/17/20 1500  BP: (!) 168/67 (!) 160/61 (!) 170/73 (!) 170/71  Pulse: (!) 53 (!) 58 (!) 55 (!) 55  Resp:      Temp:      TempSrc:      SpO2:      Weight:      Height:        Exam: Gen small framed older adult female, nasal O2,  no distress +JVD Chest clear bilat to bases RRR no RG, +2/6 sem Abd soft ntnd no mass or ascites +bs Ext no LE edema Neuro is alert, nonfocal and conversant    Home meds:  - norvasc 10/ asa 81/ lipitor 40/ hydralazine 100 tid/ metoprolol 50 bid  - insulin lantus 10 hs/ humalog 0-5 tid ssi  - depakote er 750 hs  - ferric citrate 420 ac tid  - prn's/ vitamins/ supplements    CXR - IMPRESSION: Cardiomegaly with interstitial thickening suspicious for pulmonary edema. Left lower thorax and costophrenic angle are not included in the field of view.     OP HD: [facility]   Assessment/ Plan: 1. SOB/ pulm edema - missed HD x 2.  Refused HD yest, getting HD today over at [facility] now.  Max UF as will tolerate and repeat CXR in am.  2. ESRD - on HD in [place], [place] 3. IDDM 4. Seizure d/o 5. Anemia ckd - Hb 9.9, follow 6. MBD ckd - Ca okay, cont binder        Rob Jazmeen Axtell 01/17/2020, 3:17 PM   Recent Labs  Lab 01/16/20 0534 01/17/20 1258  K 4.5 4.4  BUN 91* 90*  CREATININE 11.40* 12.38*  CALCIUM 9.3 8.9  PHOS  --  7.0*  HGB 9.9* 9.7*   Inpatient medications: . amLODipine  10 mg Oral Daily  . aspirin EC  81 mg Oral Daily  . atorvastatin  40 mg Oral Daily  . Chlorhexidine Gluconate Cloth  6 each Topical Q0600  . cholecalciferol  5,000 Units Oral Daily  . diphenhydrAMINE  25 mg Oral Once in dialysis  . divalproex  750 mg Oral QHS  . ferric citrate  420 mg Oral TID WC  . Gerhardt's butt cream   Topical BID  . heparin  5,000 Units Subcutaneous Q8H  . hydrALAZINE  100 mg  Oral TID  . insulin aspart  0-6 Units Subcutaneous TID WC  . melatonin  3 mg Oral QHS  . nystatin cream  1 application Topical BID   . sodium chloride     sodium chloride, acetaminophen **OR** acetaminophen, ondansetron **OR** ondansetron (ZOFRAN) IV

## 2020-01-17 NOTE — Progress Notes (Signed)
PIV consult: No IV meds ordered at this time. Pt declined IV at this time. [place], RN made aware.

## 2020-01-17 NOTE — ED Notes (Addendum)
Pt called out requesting a bed bath. Pt was cleaned, barrier cream applied to buttocks area, and pt was placed in a new brief and gown.

## 2020-01-18 ENCOUNTER — Inpatient Hospital Stay (HOSPITAL_COMMUNITY): Payer: Medicare Other

## 2020-01-18 LAB — GLUCOSE, CAPILLARY
Glucose-Capillary: 115 mg/dL — ABNORMAL HIGH (ref 70–99)
Glucose-Capillary: 172 mg/dL — ABNORMAL HIGH (ref 70–99)
Glucose-Capillary: 212 mg/dL — ABNORMAL HIGH (ref 70–99)
Glucose-Capillary: 144 mg/dL — ABNORMAL HIGH (ref 70–99)

## 2020-01-18 MED ORDER — DOXERCALCIFEROL 4 MCG/2ML IV SOLN: 7.0000 ug | INTRAVENOUS | Status: DC

## 2020-01-18 MED ORDER — CHLORHEXIDINE GLUCONATE CLOTH 2 % EX PADS
6.0000 | MEDICATED_PAD | Freq: Every day | CUTANEOUS | Status: DC
  Administered 2020-01-19: 6 via TOPICAL

## 2020-01-18 NOTE — Progress Notes (Signed)
PROGRESS NOTE    Ellawyn Wogan  MGQ:[id number] DOB: 30-Mar-1956 DOA: 01/16/2020 PCP: Ocie Bob, FNP   Brief Narrative:  HPI per Dr. Collier Bullock on 01/16/20 Shante Archambeault is a 64 y.o. female with medical history significant for end-stage renal disease on hemodialysis (T/TH/S), diabetes mellitus, hypertension, depression anxiety, seizure disorder and narcolepsy who was brought into the emergency room by family members last night.  Patient was recently discharged from hospital in [place] on 01/07/20.  Patient's last dialysis treatment was on Thursday 06/24.  Patient has not had any further renal replacement therapy since then because she is unable to sit in the dialysis chair for a long period of time due to pain in the perianal area from an ulcer. She also does not have transportation to get the dialysis center as scheduled which is why she was brought to the hospital because her niece is concerned that she has not had dialysis in a week.  Family is also concerned that her decubitus ulcer is getting reinfected with stool. Patient denies having any chest pain, shortness of breath, nausea, vomiting, dizziness, lightheadedness, fever or chills. Labs reveal a hemoglobin of 9.9, potassium of 4.5, serum creatinine of 11.4 and a BUN of 91 and glucose of 232. Chest x-ray shows cardiomegaly with interstitial thickening suspicious for pulmonary edema. Twelve-lead EKG shows sinus bradycardia, low voltage QRS.  ED Course: Patient is a 64 year old female with multiple medical problems who was brought into the emergency room by family members because of missed dialysis.  Patient lives in [place] but is nonmobile and family is unable to get her to her scheduled dialysis treatments because of transportation issues.  Chest x-ray shows pulmonary edema but electrolytes are within normal limits.  Patient will be admitted to the hospital and nephrology will be consulted.  Assessment & Plan:     Principal Problem:   ESRD (end stage renal disease) on dialysis [facility]) Active Problems:   Diabetes ([place])   Hypertension   Seizure disorder ([place])   Decubitus ulcer  Acute pulmonary edema/volume overload/End-Stage Renal Disease on Hemodialysis Elevated Anion Gap -Patient's dialysis days are T/TH/S -Patient has not had dialysis since Thursday, 01/10/20 -Chest x-ray shows findings concerning for pulmonary edema but potassium and bicarbonate levels are within normal limits.  She was transferred from [facility] to [facility] on 01/17/2020 and received her dialysis.  Per nephrology, next dialysis tomorrow.  She denies any shortness of breath and she is not hypoxic either but she shows some signs of confusion.  Do not know if this is her baseline.  Diabetes Mellitus with complications of End-Stage Renal Disease Hemoglobin A1c was 8.0.  Continue SSI.  Seizure Disorder -Continue Depakote ER 750 mg po BID -Valparoic Acid Level was 11 Seizure precautions.  Essential hypertension -Blood pressure fairly normal.  Continue hydralazine and amlodipine.  Sinus bradycardia -Asymptomatic -Hold Metoprolol -Continue to Monitor on Telemetry   Anemia of Chronic Kidney Disease Hemoglobin is stable up until yesterday.  Patient refused morning labs today.  Thrombocytopenia Unknown whether this is acute or chronic.  Stable until yesterday.  She refused labs from today.  No active bleeding.  Decubitus ulcer/MASD, poA Wound care on board. -C/w WOC Nurse Recc's and Gerhardt's butt paste BID  Acute metabolic encephalopathy: Patient alert but partially confused.  She is oriented to person only.  Unsure what her baseline is.  Continue HD.  DVT prophylaxis: Heparin 5,000 units sq q8h Code Status: FULL CODE Family Communication: No family  present at bedside    Status is: Inpatient  Remains inpatient appropriate because:Altered mental status, Unsafe d/c plan, IV treatments appropriate  due to intensity of illness or inability to take PO and Inpatient level of care appropriate due to severity of illness   Dispo: The patient is from: Home              Anticipated d/c is to: TBD, consulted PT OT.              Anticipated d/c date is: 1 day              Patient currently is not medically stable to d/c.  Consultants:   Nephrology    Procedures: Hemodialysis   Antimicrobials:  Anti-infectives (From admission, onward)   None     Subjective: Seen and examined.  Patient is pleasantly confused.  She has no complaints.  Objective: Vitals:   01/17/20 2026 01/18/20 0107 01/18/20 1122 01/18/20 1437  BP:  (!) 141/60 (!) 156/63 (!) 129/48  Pulse: 64 60 63 (!) 56  Resp:  16 17 16   Temp:  (!) 97.5 F (36.4 C) 97.7 F (36.5 C) 98 F (36.7 C)  TempSrc: Axillary Axillary Oral Axillary  SpO2: 100% 97% 95% 98%  Weight:      Height:        Intake/Output Summary (Last 24 hours) at 01/18/2020 1443 Last data filed at 01/18/2020 1035 Gross per 24 hour  Intake 470 ml  Output 3500 ml  Net -3030 ml   Filed Weights   01/16/20 0335 01/17/20 1203 01/17/20 1610  Weight: 56.7 kg 58.4 kg 54.2 kg   Examination:  General exam: Appears calm and comfortable  Respiratory system: Clear to auscultation. Respiratory effort normal. Cardiovascular system: S1 & S2 heard, RRR. No JVD, murmurs, rubs, gallops or clicks. No pedal edema. Gastrointestinal system: Abdomen is nondistended, soft and nontender. No organomegaly or masses felt. Normal bowel sounds heard. Central nervous system: Alert and oriented x1. No focal neurological deficits. Extremities: Symmetric 5 x 5 power. Skin: No rashes, lesions or ulcers.  Psychiatry: Judgement and insight appear poor. Mood & affect appropriate.    Data Reviewed: I have personally reviewed following labs and imaging studies  CBC: Recent Labs  Lab 01/16/20 0534 01/17/20 1258  WBC 4.6 4.5  NEUTROABS 3.1 2.6  HGB 9.9* 9.7*  HCT 34.2* 32.2*  MCV  94.2 93.1  PLT 127* 742*   Basic Metabolic Panel: Recent Labs  Lab 01/16/20 0534 01/17/20 1258  NA 143 142  K 4.5 4.4  CL 102 103  CO2 22 21*  GLUCOSE 232* 124*  BUN 91* 90*  CREATININE 11.40* 12.38*  CALCIUM 9.3 8.9  MG  --  2.0  PHOS  --  7.0*   GFR: Estimated Creatinine Clearance: 4.1 mL/min (A) (by C-G formula based on SCr of 12.38 mg/dL (H)). Liver Function Tests: Recent Labs  Lab 01/17/20 1258  AST 12*  ALT 13  ALKPHOS 100  BILITOT 1.1  PROT 6.6  ALBUMIN 2.9*   No results for input(s): LIPASE, AMYLASE in the last 168 hours. No results for input(s): AMMONIA in the last 168 hours. Coagulation Profile: No results for input(s): INR, PROTIME in the last 168 hours. Cardiac Enzymes: No results for input(s): CKTOTAL, CKMB, CKMBINDEX, TROPONINI in the last 168 hours. BNP (last 3 results) No results for input(s): PROBNP in the last 8760 hours. HbA1C: Recent Labs    01/16/20 0534  HGBA1C 8.0*   CBG: Recent Labs  Lab 01/16/20 2146 01/17/20 0759 01/17/20 2038 01/18/20 0750 01/18/20 1222  GLUCAP 252* 137* 83 115* 172*   Lipid Profile: No results for input(s): CHOL, HDL, LDLCALC, TRIG, CHOLHDL, LDLDIRECT in the last 72 hours. Thyroid Function Tests: No results for input(s): TSH, T4TOTAL, FREET4, T3FREE, THYROIDAB in the last 72 hours. Anemia Panel: No results for input(s): VITAMINB12, FOLATE, FERRITIN, TIBC, IRON, RETICCTPCT in the last 72 hours. Sepsis Labs: No results for input(s): PROCALCITON, LATICACIDVEN in the last 168 hours.  Recent Results (from the past 240 hour(s))  C Difficile Quick Screen w PCR reflex     Status: None   Collection Time: 01/16/20  3:57 AM   Specimen: STOOL  Result Value Ref Range Status   C Diff antigen NEGATIVE NEGATIVE Final   C Diff toxin NEGATIVE NEGATIVE Final   C Diff interpretation No C. difficile detected.  Final    Comment: Performed at [facility], [place] [address], [place], [place] [address]    SARS Coronavirus 2 by RT PCR (hospital order, performed in [facility] hospital lab) Nasopharyngeal Nasopharyngeal Swab     Status: None   Collection Time: 01/16/20  7:14 AM   Specimen: Nasopharyngeal Swab  Result Value Ref Range Status   SARS Coronavirus 2 NEGATIVE NEGATIVE Final    Comment: (NOTE) SARS-CoV-2 target nucleic acids are NOT DETECTED.  The SARS-CoV-2 RNA is generally detectable in upper and lower respiratory specimens during the acute phase of infection. The lowest concentration of SARS-CoV-2 viral copies this assay can detect is 250 copies / mL. A negative result does not preclude SARS-CoV-2 infection and should not be used as the sole basis for treatment or other patient management decisions.  A negative result may occur with improper specimen collection / handling, submission of specimen other than nasopharyngeal swab, presence of viral mutation(s) within the areas targeted by this assay, and inadequate number of viral copies (<250 copies / mL). A negative result must be combined with clinical observations, patient history, and epidemiological information.  Fact Sheet for Patients:   StrictlyIdeas.no  Fact Sheet for Healthcare Providers: BankingDealers.co.za  This test is not yet approved or  cleared by the [place] FDA and has been authorized for detection and/or diagnosis of SARS-CoV-2 by FDA under an Emergency Use Authorization (EUA).  This EUA will remain in effect (meaning this test can be used) for the duration of the COVID-19 declaration under Section 564(b)(1) of the Act, 21 U.S.C. section 360bbb-3(b)(1), unless the authorization is terminated or revoked sooner.  Performed at [facility], [place] [address], [place], [place] [address]     RN Pressure Injury Documentation:     Estimated body mass index is 20.51 kg/m as calculated from the following:   Height as of this encounter: 5'  4" (1.626 m).   Weight as of this encounter: 54.2 kg.  Malnutrition Type:      Malnutrition Characteristics:      Nutrition Interventions:    Radiology Studies: DG Chest 2 View  Result Date: 01/18/2020 CLINICAL DATA:  Pulmonary edema. EXAM: CHEST - 2 VIEW COMPARISON:  01/16/2020. FINDINGS: Cardiomegaly with pulmonary venous congestion. Diffuse bilateral interstitial prominence again noted consistent with interstitial edema. Slight improvement aeration noted on today's exam. No acute bony abnormality identified. IMPRESSION: Cardiomegaly with diffuse bilateral pulmonary interstitial prominence suggesting CHF. Slight improvement in aeration on today's exam. Electronically Signed   By: [place]   On: 01/18/2020 07:34   Scheduled Meds: . amLODipine  10  mg Oral Daily  . aspirin EC  81 mg Oral Daily  . atorvastatin  40 mg Oral Daily  . Chlorhexidine Gluconate Cloth  6 each Topical Q0600  . cholecalciferol  5,000 Units Oral Daily  . diphenhydrAMINE  25 mg Oral Once in dialysis  . divalproex  750 mg Oral QHS  . [START ON 01/19/2020] doxercalciferol  7 mcg Intravenous Q T,Th,Sa-HD  . ferric citrate  420 mg Oral TID WC  . Gerhardt's butt cream   Topical BID  . heparin  5,000 Units Subcutaneous Q8H  . hydrALAZINE  100 mg Oral TID  . insulin aspart  0-6 Units Subcutaneous TID WC  . melatonin  3 mg Oral QHS  . metoprolol tartrate  50 mg Oral BID  . nystatin cream  1 application Topical BID   Continuous Infusions: . sodium chloride       LOS: 2 days   Darliss Cheney, MD [facility] PAGER is on [place]  If 7PM-7AM, please contact night-coverage [email or web address]

## 2020-01-18 NOTE — Plan of Care (Signed)

## 2020-01-18 NOTE — Progress Notes (Signed)
Refusing labs at this time

## 2020-01-18 NOTE — Discharge Planning (Signed)
Contacted the [facility] to discuss transportation needs The clinic confirmed that transportation is available for her via [place].  She has steps at her home and is transported on a stretcher.

## 2020-01-18 NOTE — Progress Notes (Signed)
[facility] Progress Note  Name: Joann Bailey MRN: [id number] DOB: 1956-01-25  Chief Complaint:  Shortness of breath after missing HD  Subjective:  Transferred to [facility] and had HD on 7/1 with 3.5 kg UF.  CXR with improvement in aeration.  Per charting, she had missed outpatient HD due to transportation and not being able to sit due to pain from an ulcer.  She provides limited history and tells me that she came "for observation" and states that she does have transportation to/from dialysis and that she doesn't have a sore on her backside anymore.    Review of systems:  Denies shortness of breath or chest pain Denies cp Denies n/v   Intake/Output Summary (Last 24 hours) at 01/18/2020 1059 Last data filed at 01/18/2020 1035 Gross per 24 hour  Intake 470 ml  Output 3500 ml  Net -3030 ml    Vitals:  Vitals:   01/17/20 1610 01/17/20 1729 01/17/20 2026 01/18/20 0107  BP: (!) 193/78 (!) 184/60 129/65 (!) 141/60  Pulse: 61 60 64 60  Resp: 16 16  16   Temp: (!) 97.5 F (36.4 C) 97.9 F (36.6 C) 98 F (36.7 C) (!) 97.5 F (36.4 C)  TempSrc: Oral Oral Axillary Axillary  SpO2: 98% 98% 100% 97%  Weight: 54.2 kg     Height:         Physical Exam:  General adult female in bed in no acute distress HEENT normocephalic atraumatic extraocular movements intact sclera anicteric Neck supple trachea midline Lungs clear to auscultation bilaterally normal work of breathing at rest  Heart S1S2 no rub Abdomen soft nontender nondistended Extremities no edema  Psych normal mood and affect; question insight Access: RUE AVF bruit and thrill    Medications reviewed   Labs:  BMP Latest Ref Rng & Units 01/17/2020 01/16/2020  Glucose 70 - 99 mg/dL 124(H) 232(H)  BUN 8 - 23 mg/dL 90(H) 91(H)  Creatinine 0.44 - 1.00 mg/dL 12.38(H) 11.40(H)  Sodium 135 - 145 mmol/L 142 143  Potassium 3.5 - 5.1 mmol/L 4.4 4.5  Chloride 98 - 111 mmol/L 103 102  CO2 22 - 32 mmol/L 21(L) 22  Calcium 8.9  - 10.3 mg/dL 8.9 9.3   HD in [place] [facility] TTS schedule BF 400   DF 1.5 auto 4 hours EDW 64 kg Bath 2k/2.5 Ca Meds; hectorol 7 mcg each tx and on mircera 100 mcg (6/12) No heparin (hx of GI bleed)  Last HD there on 6/12 with departure weight of 60.5 kg; had then been in hospital; noncompliant  Assessment/Plan:   1. Shortness of breath/ pulm edema - missed HD x 2.  Optimize volume with HD  2. ESRD - on HD in [place], [place].  TTS schedule but has been noncompliant.  Plan for HD on 7/3. Ordered renal panel daily.  Contacted HD SW to discuss case as patient appears to contradict prior reports of difficulty getting to HD but is a poor historian; mailbox full - will try again  3. HTN - improved no recent vitals charted but 140's last check.   4. IDDM - per primary team   5. Seizure d/o - medications per primary team   6. Anemia ckd - noncompliant and had been at an outside hospital - uncertain of date of last ESA. Follow Hb for now   7. MBD ckd - Ca okay, cont auryxia. Continue hectorol    Claudia Desanctis, MD 01/18/2020 11:26 AM

## 2020-01-19 LAB — RENAL FUNCTION PANEL
Albumin: 2.7 g/dL — ABNORMAL LOW (ref 3.5–5.0)
Anion gap: 16 — ABNORMAL HIGH (ref 5–15)
BUN: 43 mg/dL — ABNORMAL HIGH (ref 8–23)
CO2: 24 mmol/L (ref 22–32)
Calcium: 8.8 mg/dL — ABNORMAL LOW (ref 8.9–10.3)
Chloride: 96 mmol/L — ABNORMAL LOW (ref 98–111)
Creatinine, Ser: 7.68 mg/dL — ABNORMAL HIGH (ref 0.44–1.00)
GFR calc Af Amer: 6 mL/min — ABNORMAL LOW (ref >60)
GFR calc non Af Amer: 5 mL/min — ABNORMAL LOW (ref >60)
Glucose, Bld: 153 mg/dL — ABNORMAL HIGH (ref 70–99)
Phosphorus: 6.4 mg/dL — ABNORMAL HIGH (ref 2.5–4.6)
Potassium: 3.8 mmol/L (ref 3.5–5.1)
Sodium: 136 mmol/L (ref 135–145)

## 2020-01-19 LAB — CBC
HCT: 35.2 % — ABNORMAL LOW (ref 36.0–46.0)
Hemoglobin: 10.5 g/dL — ABNORMAL LOW (ref 12.0–15.0)
MCH: 27.5 pg (ref 26.0–34.0)
MCHC: 29.8 g/dL — ABNORMAL LOW (ref 30.0–36.0)
MCV: 92.1 fL (ref 80.0–100.0)
Platelets: 137 10*3/uL — ABNORMAL LOW (ref 150–400)
RBC: 3.82 MIL/uL — ABNORMAL LOW (ref 3.87–5.11)
RDW: 15.2 % (ref 11.5–15.5)
WBC: 4.5 10*3/uL (ref 4.0–10.5)
nRBC: 0 % (ref 0.0–0.2)

## 2020-01-19 LAB — GLUCOSE, CAPILLARY
Glucose-Capillary: 150 mg/dL — ABNORMAL HIGH (ref 70–99)
Glucose-Capillary: 132 mg/dL — ABNORMAL HIGH (ref 70–99)

## 2020-01-19 LAB — HEPATITIS B SURFACE ANTIBODY,QUALITATIVE: Hep B S Ab: REACTIVE — AB

## 2020-01-19 LAB — HEPATITIS B CORE ANTIBODY, TOTAL: Hep B Core Total Ab: REACTIVE — AB

## 2020-01-19 LAB — HEPATITIS B SURFACE ANTIGEN: Hepatitis B Surface Ag: NONREACTIVE

## 2020-01-19 MED ORDER — GERHARDT'S BUTT CREAM: 1.0000 "application " | TOPICAL_CREAM | Freq: Two times a day (BID) | CUTANEOUS | 1 refills | Status: AC

## 2020-01-19 MED ORDER — DOXERCALCIFEROL 4 MCG/2ML IV SOLN
INTRAVENOUS | Status: AC
  Administered 2020-01-19: 7 ug via INTRAVENOUS
  Filled 2020-01-19: qty 4

## 2020-01-19 NOTE — Discharge Summary (Signed)
Physician Discharge Summary  Joann Bailey MBW:[id number] DOB: 09/02/1955 DOA: 01/16/2020  PCP: Ocie Bob, FNP  Admit date: 01/16/2020 Discharge date: 01/19/2020  Admitted From: Home Disposition:  Home  Discharge Condition:Stable CODE STATUS:FULL Diet recommendation: Heart Healthy  Brief/Interim Summary:  HPI per Dr. Royce Macadamia Agbata on 01/16/20 Joann Bailey a 64 y.o.femalewith medical history significant forend-stage renal disease on hemodialysis(T/TH/S),diabetes mellitus, hypertension, depression anxiety, seizure disorder and narcolepsy who was brought into the emergency room by family members last night. Patient [place].Patient's last dialysis treatment was on Thursday 06/24.Patient has not had any further renal replacement therapy since then because she is unable to sit in the dialysis chair for along period of time due to pain in the perianal area from an ulcer. She also does not have transportation to get the dialysis center as scheduledwhich is why she was brought to the hospital because her niece is concerned that she has not had dialysis in a week. Family is alsoconcerned that her decubitus ulcer is getting reinfected with stool. Patient denies having any chest pain, shortness of breath, nausea, vomiting, dizziness, lightheadedness, fever or chills. Labs reveal a hemoglobin of 9.9, potassium of 4.5, serum creatinine of 11.4 and a BUN of 91 and glucose of 232. Chest x-ray showscardiomegaly with interstitial thickening suspicious for pulmonary edema. Twelve-lead EKG shows sinus bradycardia, low voltage QRS.  ED Course:Patient is a 64 year old female with multiple medical problems who was brought into the emergency room by family members because of missed dialysis. Patient lives in [place] but is nonmobile and family is unable to get her to her scheduled dialysis treatments because of  transportation issues. Chest x-ray shows pulmonary edema but electrolytes are within normal limits. Patient will be admitted to the hospital and nephrology will be consulted  Hospital Course: Patient's hospital course remained stable.  She was seen by nephrology and she underwent dialysis twice.  Currently she is alert and oriented, comfortable.  Dialysis transportation issues have been checked and found that there are no problems on that.  She is hemodynamically stable for discharge to home today.  Following problems were addressed during her hospitalization:  Acute pulmonary edema/volume overload/End-Stage Renal Disease on Hemodialysis Elevated Anion Gap -Patient's dialysis days are T/TH/S -Patient has not had dialysis since Thursday, 01/10/20 -Chest x-ray shows findings concerning for pulmonary edema butpotassium and bicarbonate levels are within normal limits.  She was [facility] from [facility] on 01/17/2020 and received her dialysis twice.  She denies any shortness of breath and she is not hypoxic either.  Diabetes Mellitus with complications of End-Stage Renal Disease Hemoglobin A1c was 8.0.  Continue home regimen  Seizure Disorder -Continue Depakote ER 750 mg po BID  Essential hypertension -Blood pressure fairly normal.  Continue hydralazine and amlodipine.  Sinus bradycardia -Asymptomatic.resolved  Anemia of Chronic Kidney Disease Hemoglobin is stable   Thrombocytopenia Unknown whether this is acute or chronic.  Stable  Decubitus ulcer/MASD, poA Wound care on board. [place] Nurse Recc's and Gerhardt's butt paste BID  Acute metabolic encephalopathy: Resolved  Discharge Diagnoses:  Principal Problem:   ESRD (end stage renal disease) on dialysis ([place]) Active Problems:   Diabetes ([place])   Hypertension   Seizure disorder ([place])   Decubitus ulcer    Discharge Instructions  Discharge Instructions    Diet - low sodium heart  healthy   Complete by: As directed    Discharge instructions   Complete by: As directed  1)Please get dialyzed on your schedule.Donot miss any sessions. 2)Continue your home medications.  Follow-up with your PCP.   Increase activity slowly   Complete by: As directed    No wound care   Complete by: As directed      Allergies as of 01/19/2020      Reactions   Duloxetine Itching   Other reaction(s): Other (See Comments)      Medication List    TAKE these medications   amLODipine 10 MG tablet Commonly known as: NORVASC Take 10 mg by mouth daily.   aspirin 81 MG tablet Take 81 mg by mouth daily.   atorvastatin 40 MG tablet Commonly known as: LIPITOR Take 40 mg by mouth daily.   cholecalciferol 25 MCG (1000 UNIT) tablet Commonly known as: VITAMIN D3 Take 5,000 Units by mouth daily.   divalproex 250 MG 24 hr tablet Commonly known as: DEPAKOTE ER Take 750 mg by mouth at bedtime.   ferric citrate 1 GM 210 MG(Fe) tablet Commonly known as: AURYXIA Take 420 mg by mouth 3 (three) times daily with meals.   Gerhardt's butt cream Crea Apply 1 application topically 2 (two) times daily.   hydrALAZINE 100 MG tablet Commonly known as: APRESOLINE Take 100 mg by mouth 3 (three) times daily.   insulin lispro 100 UNIT/ML injection Commonly known as: HUMALOG Inject 0-5 Units into the skin 3 (three) times daily before meals. Sliding scale   Lantus SoloStar 100 UNIT/ML Solostar Pen Generic drug: insulin glargine Inject 10 Units into the skin at bedtime.   melatonin 3 MG Tabs tablet Take 3 mg by mouth at bedtime.   metoprolol tartrate 50 MG tablet Commonly known as: LOPRESSOR Take 50 mg by mouth 2 (two) times daily.   nystatin cream Commonly known as: MYCOSTATIN Apply 1 application topically 2 (two) times daily.       Follow-up Information    Ocie Bob, FNP. Schedule an appointment as soon as possible for a visit in 1 week(s).   Specialty: Nurse  Practitioner Contact information: [place] [address] [phone]              Allergies  Allergen Reactions  . Duloxetine Itching    Other reaction(s): Other (See Comments)    Consultations:  Nephrology   Procedures/Studies: DG Chest 1 View  Result Date: 01/16/2020 CLINICAL DATA:  Shortness of breath. EXAM: CHEST  1 VIEW COMPARISON:  08/13/2019 FINDINGS: Low lung volumes. Unchanged cardiomegaly. Interstitial thickening suspicious for pulmonary edema. Left lower chest and costophrenic angle are not included in the field of view. No pneumothorax or large pleural effusion. Bones are under mineralized. IMPRESSION: Cardiomegaly with interstitial thickening suspicious for pulmonary edema. Left lower thorax and costophrenic angle are not included in the field of view. Electronically Signed   By: Keith Rake M.D.   On: 01/16/2020 04:19   DG Chest 2 View  Result Date: 01/18/2020 CLINICAL DATA:  Pulmonary edema. EXAM: CHEST - 2 VIEW COMPARISON:  01/16/2020. FINDINGS: Cardiomegaly with pulmonary venous congestion. Diffuse bilateral interstitial prominence again noted consistent with interstitial edema. Slight improvement aeration noted on today's exam. No acute bony abnormality identified. IMPRESSION: Cardiomegaly with diffuse bilateral pulmonary interstitial prominence suggesting CHF. Slight improvement in aeration on today's exam. Electronically Signed   By: Marcello Moores  Register   On: 01/18/2020 07:34      Subjective: Patient seen and examined at the bedside this morning in the dialysis bed.  Currently comfortable.  Hemodynamically stable.  Alert,  oriented.  eager to go home  Discharge Exam: Vitals:   01/19/20 0830 01/19/20 0900  BP: (!) 114/50 (!) 102/45  Pulse: (!) 56 (!) 58  Resp:    Temp:    SpO2:     Vitals:   01/19/20 0728 01/19/20 0800 01/19/20 0830 01/19/20 0900  BP: 128/61 (!) 120/59 (!) 114/50 (!) 102/45  Pulse: (!) 53 (!) 54 (!) 56 (!) 58   Resp:      Temp:      TempSrc:      SpO2:      Weight:      Height:        General: Pt is alert, awake, not in acute distress Cardiovascular: RRR, S1/S2 +, no rubs, no gallops Respiratory: CTA bilaterally, no wheezing, no rhonchi Abdominal: Soft, NT, ND, bowel sounds + Extremities: no edema, no cyanosis,AV graft on he right arm    The results of significant diagnostics from this hospitalization (including imaging, microbiology, ancillary and laboratory) are listed below for reference.     Microbiology: Recent Results (from the past 240 hour(s))  C Difficile Quick Screen w PCR reflex     Status: None   Collection Time: 01/16/20  3:57 AM   Specimen: STOOL  Result Value Ref Range Status   C Diff antigen NEGATIVE NEGATIVE Final   C Diff toxin NEGATIVE NEGATIVE Final   C Diff interpretation No C. difficile detected.  Final    Comment: Performed at [facility], [place] [address] [place], [place] [address]  SARS Coronavirus 2 by RT PCR (hospital order, performed in [facility] hospital lab) Nasopharyngeal Nasopharyngeal Swab     Status: None   Collection Time: 01/16/20  7:14 AM   Specimen: Nasopharyngeal Swab  Result Value Ref Range Status   SARS Coronavirus 2 NEGATIVE NEGATIVE Final    Comment: (NOTE) SARS-CoV-2 target nucleic acids are NOT DETECTED.  The SARS-CoV-2 RNA is generally detectable in upper and lower respiratory specimens during the acute phase of infection. The lowest concentration of SARS-CoV-2 viral copies this assay can detect is 250 copies / mL. A negative result does not preclude SARS-CoV-2 infection and should not be used as the sole basis for treatment or other patient management decisions.  A negative result may occur with improper specimen collection / handling, submission of specimen other than nasopharyngeal swab, presence of viral mutation(s) within the areas targeted by this assay, and inadequate number of viral copies (<250 copies  / mL). A negative result must be combined with clinical observations, patient history, and epidemiological information.  Fact Sheet for Patients:   StrictlyIdeas.no  Fact Sheet for Healthcare Providers: BankingDealers.co.za  This test is not yet approved or  cleared by the [place] FDA and has been authorized for detection and/or diagnosis of SARS-CoV-2 by FDA under an Emergency Use Authorization (EUA).  This EUA will remain in effect (meaning this test can be used) for the duration of the COVID-19 declaration under Section 564(b)(1) of the Act, 21 U.S.C. section 360bbb-3(b)(1), unless the authorization is terminated or revoked sooner.  Performed at [facility], [place] [address], [place], [place] [address]      Labs: BNP (last 3 results) No results for input(s): BNP in the last 8760 hours. Basic Metabolic Panel: Recent Labs  Lab 01/16/20 0534 01/17/20 1258 01/19/20 0712  NA 143 142 136  K 4.5 4.4 3.8  CL 102 103 96*  CO2 22 21* 24  GLUCOSE 232* 124* 153*  BUN  91* 90* 43*  CREATININE 11.40* 12.38* 7.68*  CALCIUM 9.3 8.9 8.8*  MG  --  2.0  --   PHOS  --  7.0* 6.4*   Liver Function Tests: Recent Labs  Lab 01/17/20 1258 01/19/20 0712  AST 12*  --   ALT 13  --   ALKPHOS 100  --   BILITOT 1.1  --   PROT 6.6  --   ALBUMIN 2.9* 2.7*   No results for input(s): LIPASE, AMYLASE in the last 168 hours. No results for input(s): AMMONIA in the last 168 hours. CBC: Recent Labs  Lab 01/16/20 0534 01/17/20 1258 01/19/20 0712  WBC 4.6 4.5 4.5  NEUTROABS 3.1 2.6  --   HGB 9.9* 9.7* 10.5*  HCT 34.2* 32.2* 35.2*  MCV 94.2 93.1 92.1  PLT 127* 115* 137*   Cardiac Enzymes: No results for input(s): CKTOTAL, CKMB, CKMBINDEX, TROPONINI in the last 168 hours. BNP: Invalid input(s): POCBNP CBG: Recent Labs  Lab 01/18/20 0750 01/18/20 1222 01/18/20 1715 01/18/20 2140 01/19/20 0617  GLUCAP 115* 172*  212* 144* 150*   D-Dimer No results for input(s): DDIMER in the last 72 hours. Hgb A1c No results for input(s): HGBA1C in the last 72 hours. Lipid Profile No results for input(s): CHOL, HDL, LDLCALC, TRIG, CHOLHDL, LDLDIRECT in the last 72 hours. Thyroid function studies No results for input(s): TSH, T4TOTAL, T3FREE, THYROIDAB in the last 72 hours.  Invalid input(s): FREET3 Anemia work up No results for input(s): VITAMINB12, FOLATE, FERRITIN, TIBC, IRON, RETICCTPCT in the last 72 hours. Urinalysis No results found for: COLORURINE, APPEARANCEUR, [place], [place], [place], [place], [place], [place], PROTEINUR, UROBILINOGEN, NITRITE, LEUKOCYTESUR Sepsis Labs Invalid input(s): PROCALCITONIN,  WBC,  LACTICIDVEN Microbiology Recent Results (from the past 240 hour(s))  C Difficile Quick Screen w PCR reflex     Status: None   Collection Time: 01/16/20  3:57 AM   Specimen: STOOL  Result Value Ref Range Status   C Diff antigen NEGATIVE NEGATIVE Final   C Diff toxin NEGATIVE NEGATIVE Final   C Diff interpretation No C. difficile detected.  Final    Comment: Performed at [facility], [place] [address] [place], [place] [address]  SARS Coronavirus 2 by RT PCR (hospital order, performed in [facility] hospital lab) Nasopharyngeal Nasopharyngeal Swab     Status: None   Collection Time: 01/16/20  7:14 AM   Specimen: Nasopharyngeal Swab  Result Value Ref Range Status   SARS Coronavirus 2 NEGATIVE NEGATIVE Final    Comment: (NOTE) SARS-CoV-2 target nucleic acids are NOT DETECTED.  The SARS-CoV-2 RNA is generally detectable in upper and lower respiratory specimens during the acute phase of infection. The lowest concentration of SARS-CoV-2 viral copies this assay can detect is 250 copies / mL. A negative result does not preclude SARS-CoV-2 infection and should not be used as the sole basis for treatment or other patient management decisions.  A negative result may occur  with improper specimen collection / handling, submission of specimen other than nasopharyngeal swab, presence of viral mutation(s) within the areas targeted by this assay, and inadequate number of viral copies (<250 copies / mL). A negative result must be combined with clinical observations, patient history, and epidemiological information.  Fact Sheet for Patients:   StrictlyIdeas.no  Fact Sheet for Healthcare Providers: BankingDealers.co.za  This test is not yet approved or  cleared by the [place] FDA and has been authorized for detection and/or diagnosis of SARS-CoV-2 by FDA under an Emergency Use Authorization (  EUA).  This EUA will remain in effect (meaning this test can be used) for the duration of the COVID-19 declaration under Section 564(b)(1) of the Act, 21 U.S.C. section 360bbb-3(b)(1), unless the authorization is terminated or revoked sooner.  Performed at [facility], [place] [address], [place], [place] [address]     Please note: You were cared for by a hospitalist during your hospital stay. Once you are discharged, your primary care physician will handle any further medical issues. Please note that NO REFILLS for any discharge medications will be authorized once you are discharged, as it is imperative that you return to your primary care physician (or establish a relationship with a primary care physician if you do not have one) for your post hospital discharge needs so that they can reassess your need for medications and monitor your lab values.    Time coordinating discharge: 40 minutes  SIGNED:   Shelly Coss, MD  [facility] 01/19/2020, 10:07 AM Pager [phone]  If 7PM-7AM, please contact night-coverage [email or web address] Password [email or web address]

## 2020-01-19 NOTE — Progress Notes (Signed)
Family made aware of the discharge

## 2020-01-19 NOTE — Evaluation (Signed)
Occupational Therapy Evaluation Patient Details Name: Joann Bailey MRN: [id number] DOB: 1956/06/07 Today's Date: 01/19/2020    History of Present Illness 64 y.o female presenting to ED after missing dialysis apt. Recently d/c from [facility] 6/21. PMH includes end-stage renal disease on hemodialysis (T/TH/S), diabetes mellitus, hypertension, depression anxiety, seizure disorder and narcolepsy   Clinical Impression   Pt admitted with above diagnoses, presenting with generalized weakness and decreased cognition. Pt is poor historian and dtr was called for collateral information. At baseline, pt uses w/c and gets assistance with all BADL transfers. At time of eval, pt completing sit <> stands with mod A +2 and RW. Pt has difficulty fully extending BLEs due to tightness and pain. She presents with cognitive deficits described below (baseline). Dicussed with daughter use of HHOT to improve pt BADL independence and for caregiver training. Will continue to follow per POC listed below.    Follow Up Recommendations  Home health OT;Supervision/Assistance - 24 hour    Equipment Recommendations  3 in 1 bedside commode    Recommendations for Other Services       Precautions / Restrictions Precautions Precautions: Fall Restrictions Weight Bearing Restrictions: No      Mobility Bed Mobility               General bed mobility comments: up EOB with PT on arrival  Transfers Overall transfer level: Needs assistance Equipment used: Rolling walker (2 wheeled) Transfers: Sit to/from Stand Sit to Stand: Mod assist;+2 safety/equipment         General transfer comment: mod A +2 for safety to stand and clear hips from bed. Pt with narrow BOS and difficulty achieving full knee extension with WB    Balance Overall balance assessment: Needs assistance Sitting-balance support: Feet supported Sitting balance-Leahy Scale: Fair     Standing balance support: Bilateral upper extremity  supported;During functional activity Standing balance-Leahy Scale: Poor Standing balance comment: reliant on therapist support and RW                           ADL either performed or assessed with clinical judgement   ADL Overall ADL's : Needs assistance/impaired Eating/Feeding: Set up;Sitting   Grooming: Set up;Sitting   Upper Body Bathing: Moderate assistance;Sitting   Lower Body Bathing: Moderate assistance;Sitting/lateral leans;Sit to/from stand   Upper Body Dressing : Minimal assistance;Sitting   Lower Body Dressing: Moderate assistance;Sitting/lateral leans;Sit to/from stand Lower Body Dressing Details (indicate cue type and reason): pt is able to reach down to feet, but is not stable in standing Toilet Transfer: Moderate assistance;+2 for safety/equipment;RW Toilet Transfer Details (indicate cue type and reason): mod A +2 for safety with simulated mobility in room. Pt with difficulty advancing BLEs in transfers [place] and Hygiene: Maximal assistance;Sitting/lateral lean;Sit to/from stand       Functional mobility during ADLs: Moderate assistance;+2 for safety/equipment;Cueing for safety       Vision Patient Visual Report: No change from baseline       Perception     Praxis      Pertinent Vitals/Pain Pain Assessment: Faces Faces Pain Scale: Hurts little more Pain Location: bil legs with extension Pain Descriptors / Indicators: Aching;Sore Pain Intervention(s): Monitored during session     Hand Dominance     Extremity/Trunk Assessment Upper Extremity Assessment Upper Extremity Assessment: Generalized weakness   Lower Extremity Assessment Lower Extremity Assessment: Defer to PT evaluation       Communication Communication Communication: No  difficulties   Cognition Arousal/Alertness: Awake/alert Behavior During Therapy: Flat affect Overall Cognitive Status: No family/caregiver present to determine baseline  cognitive functioning Area of Impairment: Orientation;Memory;Following commands;Safety/judgement;Problem solving                 Orientation Level: Disoriented to;Place;Time;Situation   Memory: Decreased short-term memory Following Commands: Follows one step commands with increased time Safety/Judgement: Decreased awareness of safety;Decreased awareness of deficits   Problem Solving: Slow processing;Decreased initiation;Difficulty sequencing;Requires verbal cues;Requires tactile cues General Comments: pt not oriented to current situation or place without multimodal cueing. Overall poor historian about PLOF. Increased time and cues needed for problem solving basic mobility tasks   General Comments       Exercises     Shoulder Instructions      Home Living Family/patient expects to be discharged to:: Private residence Living Arrangements: Children (daughter) Available Help at Discharge: Family;Available 24 hours/day Type of Home: House Home Access: Stairs to enter [employer] of Steps: 3   Home Layout: One level         Bathroom Toilet: Standard     Home Equipment: Environmental consultant - 2 wheels;Wheelchair - manual          Prior Functioning/Environment Level of Independence: Needs assistance  Gait / Transfers Assistance Needed: uses walker vs wheelchair ADL's / Homemaking Assistance Needed: daughter assist s needed for BADLs            OT Problem List: Decreased strength;Impaired balance (sitting and/or standing);Decreased cognition;Decreased safety awareness;Decreased activity tolerance;Decreased knowledge of use of DME or AE      OT Treatment/Interventions: Self-care/ADL training;DME and/or AE instruction;Therapeutic activities;Balance training;Therapeutic exercise;Energy conservation;Patient/family education    OT Goals(Current goals can be found in the care plan section) Acute Rehab OT Goals Patient Stated Goal: go home OT Goal Formulation: With  patient Time For Goal Achievement: 02/02/20 Potential to Achieve Goals: Good  OT Frequency: Min 2X/week   Barriers to D/C:            Co-evaluation PT/OT/SLP Co-Evaluation/Treatment: Yes Reason for Co-Treatment: Complexity of the patient's impairments (multi-system involvement)   OT goals addressed during session: ADL's and self-care;Proper use of Adaptive equipment and DME;Strengthening/ROM      AM-PAC OT "6 Clicks" Daily Activity     Outcome Measure Help from another person eating meals?: A Little Help from another person taking care of personal grooming?: A Little Help from another person toileting, which includes using toliet, bedpan, or urinal?: A Lot Help from another person bathing (including washing, rinsing, drying)?: A Lot Help from another person to put on and taking off regular upper body clothing?: A Little Help from another person to put on and taking off regular lower body clothing?: A Lot 6 Click Score: 15   End of Session Equipment Utilized During Treatment: Rolling walker Nurse Communication: Mobility status  Activity Tolerance: Patient tolerated treatment well Patient left: in bed;with call bell/phone within reach;with bed alarm set  OT Visit Diagnosis: Other abnormalities of gait and mobility (R26.89);Muscle weakness (generalized) (M62.81);Other symptoms and signs involving cognitive function                Time: 9030-0923 OT Time Calculation (min): 13 min Charges:  OT General Charges $OT Visit: 1 Visit OT Evaluation $OT Eval Moderate Complexity: [place], MSOT, OTR/L [facility] Office Number: [phone] Pager: [phone]  Zenovia Jarred 01/19/2020, 1:52 PM

## 2020-01-19 NOTE — TOC Transition Note (Signed)
Transition of Care [facility]) - CM/SW Discharge Note   Patient Details  Name: Joann Bailey MRN: [id number] Date of Birth: Nov 05, 1955  Transition of Care [facility]) CM/SW Contact:  Claudie Leach, RN 01/19/2020, 1:18 PM   Clinical Narrative:    Patient to d/c home with family.  Per daughter, [name], patient has not used [place] services and they have no preference of agency.  Medicare list discussed.  Referral accepted by Sharmon Revere with [facility].     No DME needs per PT.  Final next level of care: [place] Barriers to Discharge: No Barriers Identified   Patient Goals and CMS Choice Patient states their goals for this hospitalization and ongoing recovery are:: to get well CMS Medicare.gov Compare Post Acute Care list provided to:: Patient Represenative (must comment) (daughter [name]) Choice offered to / list presented to : Adult Children   Discharge Plan and Services      HH Arranged: PT, OT [facility] Agency: [place] Date [facility] Agency Contacted: 01/19/20 Time [place]: [place] Representative spoke with at [place]: Sharmon Revere

## 2020-01-19 NOTE — Evaluation (Signed)
Physical Therapy Evaluation Patient Details Name: Joann Bailey MRN: [id number] DOB: 03-15-1956 Today's Date: 01/19/2020   History of Present Illness  64 y.o female presenting to ED after missing dialysis apt. Recently d/c from [facility] 6/21. PMH includes end-stage renal disease on hemodialysis (T/TH/S), diabetes mellitus, hypertension, depression anxiety, seizure disorder and narcolepsy  Clinical Impression  Prior to admission, pt lives with her daughter, requires assist for transfers and uses a wheelchair. Pt presents with generalized weakness, cognitive impairments, balance deficits, and decreased activity tolerance. Requiring moderate assist to stand from edge of bed to walker and unable to ambulate. Pt daughter reports increasing difficulty with assisting pt with transfers. Pt would benefit from HHPT to maximize functional mobility and decrease caregiver burden.     Follow Up Recommendations Home health PT;Supervision/Assistance - 24 hour    Equipment Recommendations  None recommended by PT    Recommendations for Other Services       Precautions / Restrictions Precautions Precautions: Fall Restrictions Weight Bearing Restrictions: No      Mobility  Bed Mobility Overal bed mobility: Needs Assistance Bed Mobility: Supine to Sit     Supine to sit: Supervision     General bed mobility comments: up EOB with PT on arrival  Transfers Overall transfer level: Needs assistance Equipment used: Rolling walker (2 wheeled) Transfers: Sit to/from Stand Sit to Stand: Mod assist;+2 safety/equipment         General transfer comment: mod A +2 for safety to stand and clear hips from bed. Pt with narrow BOS and difficulty achieving full knee extension with WB  Ambulation/Gait             General Gait Details: unable  Stairs            Wheelchair Mobility    Modified Rankin (Stroke Patients Only)       Balance Overall balance assessment: Needs  assistance Sitting-balance support: Feet supported Sitting balance-Leahy Scale: Fair     Standing balance support: Bilateral upper extremity supported;During functional activity Standing balance-Leahy Scale: Poor Standing balance comment: reliant on therapist support and RW                             Pertinent Vitals/Pain Pain Assessment: Faces Faces Pain Scale: Hurts little more Pain Location: bil legs with extension Pain Descriptors / Indicators: Aching;Sore Pain Intervention(s): Limited activity within patient's tolerance;Monitored during session    [place] expects to be discharged to:: Private residence Living Arrangements: Children (daughter) Available Help at Discharge: Family;Available 24 hours/day Type of Home: House Home Access: Stairs to enter   [employer] of Steps: 3 Home Layout: One level Home Equipment: Walker - 2 wheels;Wheelchair - manual      Prior Function Level of Independence: Needs assistance   Gait / Transfers Assistance Needed: uses walker vs wheelchair  ADL's / Homemaking Assistance Needed: daughter assist s needed for BADLs        Hand Dominance        Extremity/Trunk Assessment   Upper Extremity Assessment Upper Extremity Assessment: Generalized weakness    Lower Extremity Assessment Lower Extremity Assessment: RLE deficits/detail;LLE deficits/detail RLE Deficits / Details: Able to reach neutral knee extension with quad set LLE Deficits / Details: Able to reach neutral knee extension with quad set    Cervical / Trunk Assessment Cervical / Trunk Assessment: Kyphotic  Communication   Communication: No difficulties  Cognition Arousal/Alertness: Awake/alert Behavior During Therapy: Flat affect  Overall Cognitive Status: No family/caregiver present to determine baseline cognitive functioning Area of Impairment: Orientation;Memory;Following commands;Safety/judgement;Problem solving                  Orientation Level: Disoriented to;Place;Time;Situation   Memory: Decreased short-term memory Following Commands: Follows one step commands with increased time Safety/Judgement: Decreased awareness of safety;Decreased awareness of deficits   Problem Solving: Slow processing;Decreased initiation;Difficulty sequencing;Requires verbal cues;Requires tactile cues General Comments: pt not oriented to current situation or place without multimodal cueing. Overall poor historian about PLOF. Increased time and cues needed for problem solving basic mobility tasks      General Comments      Exercises     Assessment/Plan    PT Assessment Patient needs continued PT services  PT Problem List Decreased strength;Decreased activity tolerance;Decreased balance;Decreased mobility;Decreased cognition;Decreased safety awareness       PT Treatment Interventions DME instruction;Gait training;Functional mobility training;Therapeutic activities;Therapeutic exercise;Balance training;Patient/family education    PT Goals (Current goals can be found in the Care Plan section)  Acute Rehab PT Goals Patient Stated Goal: go home PT Goal Formulation: With patient Time For Goal Achievement: 02/02/20 Potential to Achieve Goals: Fair    Frequency Min 3X/week   Barriers to discharge        Co-evaluation PT/OT/SLP Co-Evaluation/Treatment: Yes Reason for Co-Treatment: For patient/therapist safety;To address functional/ADL transfers PT goals addressed during session: Mobility/safety with mobility OT goals addressed during session: ADL's and self-care;Proper use of Adaptive equipment and DME;Strengthening/ROM       AM-PAC PT "6 Clicks" Mobility  Outcome Measure Help needed turning from your back to your side while in a flat bed without using bedrails?: None Help needed moving from lying on your back to sitting on the side of a flat bed without using bedrails?: None Help needed moving to and from a  bed to a chair (including a wheelchair)?: A Lot Help needed standing up from a chair using your arms (e.g., wheelchair or bedside chair)?: A Lot Help needed to walk in hospital room?: A Lot Help needed climbing 3-5 steps with a railing? : Total 6 Click Score: 15    End of Session Equipment Utilized During Treatment: Gait belt Activity Tolerance: Patient limited by fatigue Patient left: in bed;with call bell/phone within reach;with bed alarm set Nurse Communication: Mobility status PT Visit Diagnosis: Unsteadiness on feet (R26.81);Other abnormalities of gait and mobility (R26.89);Muscle weakness (generalized) (M62.81)    Time: 1884-1660 PT Time Calculation (min) (ACUTE ONLY): 21 min   Charges:   PT Evaluation $PT Eval Moderate Complexity: 1 Mod            Wyona Almas, PT, DPT Acute Rehabilitation Services Pager [phone] Office [phone]   Deno Etienne 01/19/2020, 4:07 PM

## 2020-01-19 NOTE — Progress Notes (Signed)
PT Cancellation Note  Patient Details Name: Joann Bailey MRN: [id number] DOB: 07-May-1956   Cancelled Treatment:    Reason Eval/Treat Not Completed: Patient at procedure or test/unavailable (HD).    Wyona Almas, PT, DPT Acute Rehabilitation Services Pager [phone] Office [phone]    Deno Etienne 01/19/2020, 8:06 AM

## 2020-01-19 NOTE — Progress Notes (Signed)
[facility] Progress Note  Name: Joann Bailey MRN: [id number] DOB: 03-Sep-1955  Chief Complaint:  Shortness of breath after missing HD  Subjective:  The HD SW contacted her clinic and confirmed she does have transportation.  Seen and examined on HD at 9:00 am procedure supervised.  BP 102/45 and HR 58.  AVF in use.    Review of systems:   Denies shortness of breath or chest pain Denies cp Denies n/v   Intake/Output Summary (Last 24 hours) at 01/19/2020 0853 Last data filed at 01/19/2020 0513 Gross per 24 hour  Intake 790 ml  Output --  Net 790 ml    Vitals:  Vitals:   01/19/20 0718 01/19/20 0728 01/19/20 0800 01/19/20 0830  BP: 126/61 128/61 (!) 120/59 (!) 114/50  Pulse: (!) 53 (!) 53 (!) 54 (!) 56  Resp:      Temp: 98.6 F (37 C)     TempSrc: Oral     SpO2:      Weight:      Height:         Physical Exam:   General adult female in bed in no acute distress HEENT normocephalic atraumatic extraocular movements intact sclera anicteric Neck supple trachea midline Lungs clear to auscultation bilaterally normal work of breathing at rest  Heart S1S2 no rub Abdomen soft nontender nondistended Extremities no edema  Psych normal mood and affect; question insight Access: RUE AVF - in use    Medications reviewed    Labs:  BMP Latest Ref Rng & Units 01/19/2020 01/17/2020 01/16/2020  Glucose 70 - 99 mg/dL 153(H) 124(H) 232(H)  BUN 8 - 23 mg/dL 43(H) 90(H) 91(H)  Creatinine 0.44 - 1.00 mg/dL 7.68(H) 12.38(H) 11.40(H)  Sodium 135 - 145 mmol/L 136 142 143  Potassium 3.5 - 5.1 mmol/L 3.8 4.4 4.5  Chloride 98 - 111 mmol/L 96(L) 103 102  CO2 22 - 32 mmol/L 24 21(L) 22  Calcium 8.9 - 10.3 mg/dL 8.8(L) 8.9 9.3   HD in [place] [facility] TTS schedule BF 400   DF 1.5 auto 4 hours EDW 64 kg Bath 2k/2.5 Ca Meds; hectorol 7 mcg each tx and on mircera 100 mcg (6/12) No heparin (hx of GI bleed)  Last HD there on 6/12 with departure weight of 60.5 kg; had then been in  hospital; noncompliant  Assessment/Plan:   1. Shortness of breath/ pulm edema - missed HD x 2.  Optimize volume with HD  2. ESRD - on HD in [place], [place].  TTS schedule outpatient but has been noncompliant.   - HD per TTS schedule  3. HTN - acceptable   4. IDDM - per primary team   5. Seizure d/o - medications per primary team   6. Anemia ckd - noncompliant and had been at an outside hospital - uncertain of date of last ESA. Follow Hb for now - acceptable  7. MBD ckd - cont auryxia. Continue hectorol   Disposition - per primary team.  Note that the HD SW on call contacted contacted the [facility] to discuss transportation needs and the clinic confirmed that transportation is available for her via [place]; she is transported on a stretcher.  So transportation to HD is not a barrier to discharge.   Claudia Desanctis, MD 01/19/2020 9:07 AM

## 2020-01-19 NOTE — Progress Notes (Signed)
OT Cancellation Note  Patient Details Name: Joann Bailey MRN: [id number] DOB: September 27, 1955   Cancelled Treatment:    Reason Eval/Treat Not Completed: Patient at procedure or test/ unavailable (Pt at HD)  Zenovia Jarred, [place], OTR/L [facility] Office Number: [phone] Pager: [phone]  Zenovia Jarred 01/19/2020, 11:38 AM

## 2020-03-24 ENCOUNTER — Other Ambulatory Visit: Payer: Self-pay

## 2020-03-24 ENCOUNTER — Emergency Department (HOSPITAL_COMMUNITY)
Admission: EM | Admit: 2020-03-24 | Discharge: 2020-03-24 | Disposition: A | Discharge status: Home / Self Care | Payer: Medicare Other | Attending: Emergency Medicine | Admitting: Emergency Medicine

## 2020-03-24 ENCOUNTER — Emergency Department (HOSPITAL_COMMUNITY): Payer: Medicare Other

## 2020-03-24 ENCOUNTER — Encounter (HOSPITAL_COMMUNITY): Payer: Self-pay | Admitting: Emergency Medicine

## 2020-03-24 DIAGNOSIS — I120 Hypertensive chronic kidney disease with stage 5 chronic kidney disease or end stage renal disease: Secondary | ICD-10-CM | POA: Diagnosis not present

## 2020-03-24 DIAGNOSIS — Z79899 Other long term (current) drug therapy: Secondary | ICD-10-CM | POA: Diagnosis not present

## 2020-03-24 DIAGNOSIS — Z87891 Personal history of nicotine dependence: Secondary | ICD-10-CM | POA: Diagnosis not present

## 2020-03-24 DIAGNOSIS — N186 End stage renal disease: Secondary | ICD-10-CM | POA: Diagnosis not present

## 2020-03-24 DIAGNOSIS — E1122 Type 2 diabetes mellitus with diabetic chronic kidney disease: Secondary | ICD-10-CM | POA: Diagnosis not present

## 2020-03-24 DIAGNOSIS — Z20822 Contact with and (suspected) exposure to covid-19: Secondary | ICD-10-CM | POA: Insufficient documentation

## 2020-03-24 DIAGNOSIS — R5383 Other fatigue: Secondary | ICD-10-CM | POA: Insufficient documentation

## 2020-03-24 DIAGNOSIS — Z7982 Long term (current) use of aspirin: Secondary | ICD-10-CM | POA: Insufficient documentation

## 2020-03-24 DIAGNOSIS — R531 Weakness: Secondary | ICD-10-CM

## 2020-03-24 DIAGNOSIS — Z794 Long term (current) use of insulin: Secondary | ICD-10-CM | POA: Diagnosis not present

## 2020-03-24 DIAGNOSIS — Z992 Dependence on renal dialysis: Secondary | ICD-10-CM | POA: Diagnosis not present

## 2020-03-24 LAB — CBC WITH DIFFERENTIAL/PLATELET
Abs Immature Granulocytes: 0.03 10*3/uL (ref 0.00–0.07)
Basophils Absolute: 0.1 10*3/uL (ref 0.0–0.1)
Basophils Relative: 1 %
Eosinophils Absolute: 0.1 10*3/uL (ref 0.0–0.5)
Eosinophils Relative: 1 %
HCT: 43.3 % (ref 36.0–46.0)
Hemoglobin: 12.5 g/dL (ref 12.0–15.0)
Immature Granulocytes: 0 %
Lymphocytes Relative: 14 %
Lymphs Abs: 1.1 10*3/uL (ref 0.7–4.0)
MCH: 28.3 pg (ref 26.0–34.0)
MCHC: 28.9 g/dL — ABNORMAL LOW (ref 30.0–36.0)
MCV: 98 fL (ref 80.0–100.0)
Monocytes Absolute: 0.8 10*3/uL (ref 0.1–1.0)
Monocytes Relative: 10 %
Neutro Abs: 5.6 10*3/uL (ref 1.7–7.7)
Neutrophils Relative %: 74 %
Platelets: 180 10*3/uL (ref 150–400)
RBC: 4.42 MIL/uL (ref 3.87–5.11)
RDW: 20.1 % — ABNORMAL HIGH (ref 11.5–15.5)
WBC: 7.7 10*3/uL (ref 4.0–10.5)
nRBC: 0.4 % — ABNORMAL HIGH (ref 0.0–0.2)

## 2020-03-24 LAB — BASIC METABOLIC PANEL
Anion gap: >20 — ABNORMAL HIGH (ref 5–15)
BUN: 76 mg/dL — ABNORMAL HIGH (ref 8–23)
CO2: 16 mmol/L — ABNORMAL LOW (ref 22–32)
Calcium: 8.3 mg/dL — ABNORMAL LOW (ref 8.9–10.3)
Chloride: 99 mmol/L (ref 98–111)
Creatinine, Ser: 11.1 mg/dL — ABNORMAL HIGH (ref 0.44–1.00)
GFR calc Af Amer: 4 mL/min — ABNORMAL LOW (ref >60)
GFR calc non Af Amer: 3 mL/min — ABNORMAL LOW (ref >60)
Glucose, Bld: 75 mg/dL (ref 70–99)
Potassium: 4.5 mmol/L (ref 3.5–5.1)
Sodium: 141 mmol/L (ref 135–145)

## 2020-03-24 LAB — SARS CORONAVIRUS 2 BY RT PCR (HOSPITAL ORDER, PERFORMED IN ~~LOC~~ HOSPITAL LAB): SARS Coronavirus 2: NEGATIVE

## 2020-03-24 LAB — CBG MONITORING, ED: Glucose-Capillary: 72 mg/dL (ref 70–99)

## 2020-03-24 NOTE — ED Provider Notes (Signed)
[facility] EMERGENCY DEPARTMENT Provider Note   CSN: [id number] Arrival date & time: 03/24/20  1526     History Chief Complaint  Patient presents with  . Weakness    Joann Bailey is a 64 y.o. female.  Patient is a dialysis patient normally dialyzed by dialysis center in [place].  Last dialyzed on August 31.  Patient normally dialyzed Tuesday Thursdays and Saturdays.  Patient due for dialysis tomorrow.  Patient just with some generalized weakness for the past 2 days.  Had some leg swelling.  Discussion with patient's family members pretty much bedridden.  Patient has not had any of the Covid vaccines.  Patient originally told me she did.  But family said she did not.  Patient's been having some diarrhea at x4 weeks.  Is plugged into gastroenterology related to this.  Patient without any fevers cough or congestion.        Past Medical History:  Diagnosis Date  . Diabetes ([place])   . ESRD (end stage renal disease) on dialysis ([place])   . Heart murmur   . Hypertension   . Metabolic encephalopathy   . Seizures [facility])     Patient Active Problem List   Diagnosis Date Noted  . ESRD (end stage renal disease) on dialysis ([place])   . Diabetes ([place])   . Hypertension   . Seizure disorder ([place])   . Decubitus ulcer     Past Surgical History:  Procedure Laterality Date  . CATARACT EXTRACTION    . EYE SURGERY       OB History   No obstetric history on file.     Family History  Problem Relation Age of Onset  . Hypertension Mother   . Cancer Mother   . Hypertension Father   . Diabetes Father   . Hypertension Sister   . Diabetes Sister   . Hypertension Brother   . Diabetes Brother     Social History   Tobacco Use  . Smoking status: Former Research scientist (life sciences)  . Smokeless tobacco: Never Used  Vaping Use  . Vaping Use: Never used  Substance Use Topics  . Alcohol use: Never  . Drug use: Never    Home Medications Prior to Admission medications   Medication Sig Start Date End Date  Taking? Authorizing Provider  amLODipine (NORVASC) 10 MG tablet Take 10 mg by mouth daily.     [provider]  aspirin 81 MG tablet Take 81 mg by mouth daily.  10/21/10   [provider]  atorvastatin (LIPITOR) 40 MG tablet Take 40 mg by mouth daily.    [provider]  cholecalciferol (VITAMIN D3) 25 MCG (1000 UNIT) tablet Take 5,000 Units by mouth daily.    [provider]  divalproex (DEPAKOTE ER) 250 MG 24 hr tablet Take 750 mg by mouth at bedtime.    [provider]  ferric citrate (AURYXIA) 1 GM 210 MG(Fe) tablet Take 420 mg by mouth 3 (three) times daily with meals.    [provider]  hydrALAZINE (APRESOLINE) 100 MG tablet Take 100 mg by mouth 3 (three) times daily.     [provider]  insulin glargine (LANTUS SOLOSTAR) 100 UNIT/ML Solostar Pen Inject 10 Units into the skin at bedtime.    [provider]  insulin lispro (HUMALOG) 100 UNIT/ML injection Inject 0-5 Units into the skin 3 (three) times daily before meals. Sliding scale    [provider]  melatonin 3 MG TABS tablet Take 3 mg by mouth at  bedtime.    [provider]  metoprolol tartrate (LOPRESSOR) 50 MG tablet Take 50 mg by mouth 2 (two) times daily.    [provider]  nystatin cream (MYCOSTATIN) Apply 1 application topically 2 (two) times daily.    [provider]    Allergies    Duloxetine  Review of Systems   Review of Systems  Constitutional: Positive for fatigue. Negative for chills and fever.  HENT: Negative for rhinorrhea and sore throat.   Eyes: Negative for visual disturbance.  Respiratory: Negative for cough and shortness of breath.   Cardiovascular: Positive for leg swelling. Negative for chest pain.  Gastrointestinal: Negative for abdominal pain, diarrhea, nausea and vomiting.  Genitourinary: Negative for dysuria.  Musculoskeletal: Negative for back pain and neck pain.  Skin: Negative for rash.    Neurological: Positive for weakness. Negative for dizziness, light-headedness and headaches.  Hematological: Does not bruise/bleed easily.  Psychiatric/Behavioral: Negative for confusion.    Physical Exam Updated Vital Signs BP (!) 152/87 (BP Location: Right Arm)   Pulse 84   Temp 98.4 F (36.9 C) (Oral)   Resp 18   Ht 1.626 m (5\' 4" )   Wt 69.9 kg   SpO2 95%   BMI 26.43 kg/m   Physical Exam Vitals and nursing note reviewed.  Constitutional:      General: She is not in acute distress.    Appearance: Normal appearance. She is well-developed.  HENT:     Head: Normocephalic and atraumatic.  Eyes:     Conjunctiva/sclera: Conjunctivae normal.     Pupils: Pupils are equal, round, and reactive to light.  Cardiovascular:     Rate and Rhythm: Normal rate and regular rhythm.     Heart sounds: No murmur heard.   Pulmonary:     Effort: Pulmonary effort is normal. No respiratory distress.     Breath sounds: Normal breath sounds.  Abdominal:     Palpations: Abdomen is soft.     Tenderness: There is no abdominal tenderness.  Musculoskeletal:        General: Normal range of motion.     Cervical back: Normal range of motion and neck supple.     Right lower leg: Edema present.     Left lower leg: Edema present.     Comments: Right lower extremity with more swelling than left.  Right lower extremity with a early skin breakdown ulcer on the heel.  Also around the buttocks area there is some early skin breakdown.  Patient with AV fistula right upper extremity.  With good pulse thrill and thrill.  Skin:    General: Skin is warm and dry.  Neurological:     General: No focal deficit present.     Mental Status: She is alert. Mental status is at baseline.     Cranial Nerves: No cranial nerve deficit.     Sensory: No sensory deficit.     Motor: No weakness.     Comments: Patient alert.  Oriented to most things.  Does get some information confused about like having the Covid vaccine when she  did not.     ED Results / Procedures / Treatments   Labs (all labs ordered are listed, but only abnormal results are displayed) Labs Reviewed  CBC WITH DIFFERENTIAL/PLATELET - Abnormal; Notable for the following components:      Result Value   MCHC 28.9 (*)    RDW 20.1 (*)    nRBC 0.4 (*)    All other components  within normal limits  BASIC METABOLIC PANEL  CBG MONITORING, ED    EKG None   ED ECG REPORT   Date: 03/24/2020  Rate: 89  Rhythm: normal sinus rhythm  QRS Axis: right  Intervals: normal  ST/T Wave abnormalities: nonspecific ST changes  Conduction Disutrbances:right bundle branch block  Narrative Interpretation:   Old EKG Reviewed: none available EKG without significant changes from June.  Not we can get that open up.  This EKG did not crossover into MUSE.  Patient's QRS complexes are a little widened.  And they were in June as well.  She has a dialysis patient.   I have personally reviewed the EKG tracing and agree with the computerized printout as noted.   Radiology DG Chest Port 1 View  Result Date: 03/24/2020 CLINICAL DATA:  End-stage renal disease, seizure, metabolic encephalopathy EXAM: PORTABLE CHEST 1 VIEW COMPARISON:  01/18/2020 FINDINGS: Lung volumes are small with resultant vascular crowding at the hila. No superimposed focal pulmonary infiltrate. No pneumothorax or pleural effusion. Moderate cardiomegaly appears stable. No acute bone abnormality. IMPRESSION: 1. Low lung volumes with resultant vascular crowding. No superimposed pulmonary infiltrate. 2. Moderate cardiomegaly, stable. Electronically Signed   By: Fidela Salisbury MD   On: 03/24/2020 17:02    Procedures Procedures (including critical care time)  Medications Ordered in ED Medications - No data to display  ED Course  I have reviewed the triage vital signs and the nursing notes.  Pertinent labs & imaging results that were available during my care of the patient were reviewed by me and  considered in my medical decision making (see chart for details).    MDM Rules/Calculators/A&P                          Patient's work-up chest x-ray labs no urgent need for dialysis tonight.  Patient go to dialysis tomorrow.  No evidence of any significant pulmonary edema.  Oxygen saturations have been 92 or better on room air.  The right leg being more swollen than the left is of some concern.  Patient not showing any signs of pulmonary embolus at this time.  Would be best to have a Doppler ultrasound of the extremity done.  But is not available here.  Family would prefer to get it done through primary care provider.  And patient should be able to go to dialysis tomorrow.  When headed ordered a Covid test just due to her symptoms and some of the diarrhea even that may be somewhat chronic family did not want to wait for the results of that which is fine I will follow up on that.  That test has been done and is pending.    Final Clinical Impression(s) / ED Diagnoses Final diagnoses:  None    Rx / DC Orders ED Discharge Orders    None       Fredia Sorrow, MD 03/24/20 2014

## 2020-03-24 NOTE — ED Triage Notes (Signed)
Pt c/o weakness x 2 days and leg swelling

## 2020-03-24 NOTE — ED Notes (Signed)
Pt in bed, family at bedside, pt had small diarrhea bm, cleaned pt and changed depends.

## 2020-03-24 NOTE — ED Notes (Signed)
Pt helped into bed, pt had a watery diarrhea bm, cleaned pt and changed depends.  Pt states that she is here because she isn't going to dialysis, talked with pt about her dialysis, pt states that she doesn't want to go to dialysis, pt states that she doesn't know what to do, states that she just wants to give up, pt asked this nurse what she should do, attempted to support pt, MD notified about pt.

## 2020-03-24 NOTE — Discharge Instructions (Addendum)
Follow-up with dialysis for tomorrow.  Today's work-up without any acute reason for dialysis today.  Also did Covid testing just in case of the generalized weakness not feeling well and the diarrhea is related.  Otherwise follow-up with GI medicine as you have been for the diarrhea.  X-ray negative.  Lecture lites without significant abnormalities.  Covid test is pending.

## 2020-05-19 DEATH — deceased

## 2022-04-07 IMAGING — DX DG CHEST 1V PORT
1 series · 1 of 1 positions shown · non-contrast
Comparison: 01/18/2020

CLINICAL DATA: End-stage renal disease, seizure, metabolic
encephalopathy

EXAM:
PORTABLE CHEST 1 VIEW

[chest ap]
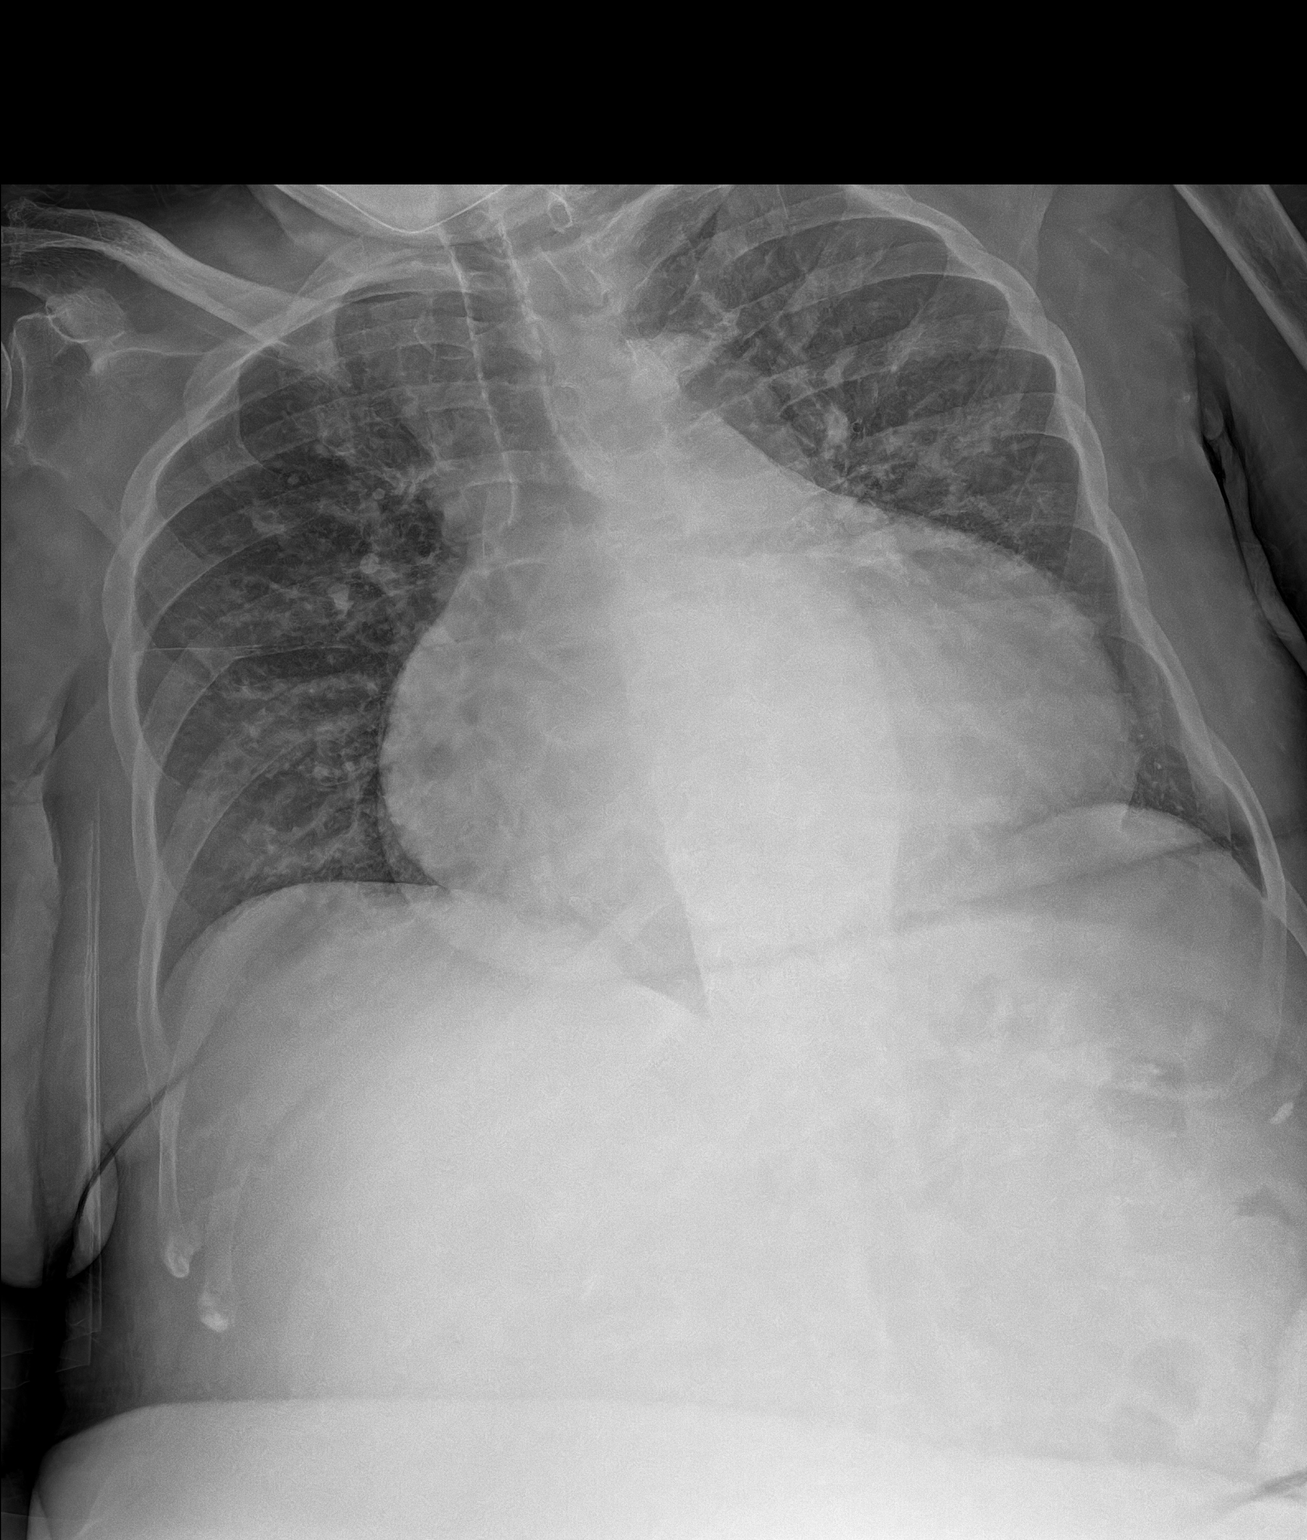

[1 of 1 positions shown; findings below may reference images not displayed]

FINDINGS: Lung volumes are small with resultant vascular crowding at the hila.
No superimposed focal pulmonary infiltrate. No pneumothorax or
pleural effusion. Moderate cardiomegaly appears stable. No acute
bone abnormality.
IMPRESSION: 1. Low lung volumes with resultant vascular crowding. No
superimposed pulmonary infiltrate.
2. Moderate cardiomegaly, stable.
